# Patient Record
Sex: Female | Born: 1985 | Race: Black or African American | Hispanic: No | Marital: Married | Smoking: Never smoker
Health system: Southern US, Community
[De-identification: ages and names within clinical notes are randomized; demographics above are authoritative.]

## PROBLEM LIST (undated history)

## (undated) ENCOUNTER — Inpatient Hospital Stay (HOSPITAL_COMMUNITY): Payer: Self-pay

## (undated) DIAGNOSIS — B009 Herpesviral infection, unspecified: Secondary | ICD-10-CM

## (undated) DIAGNOSIS — A630 Anogenital (venereal) warts: Secondary | ICD-10-CM

## (undated) DIAGNOSIS — R87629 Unspecified abnormal cytological findings in specimens from vagina: Secondary | ICD-10-CM

## (undated) HISTORY — DX: Anogenital (venereal) warts: A63.0

## (undated) HISTORY — DX: Unspecified abnormal cytological findings in specimens from vagina: R87.629

## (undated) HISTORY — PX: APPENDECTOMY: SHX54

---

## 2006-11-29 ENCOUNTER — Emergency Department (HOSPITAL_COMMUNITY): Admission: EM | Admit: 2006-11-29 | Discharge: 2006-11-29 | Payer: Self-pay | Admitting: Emergency Medicine

## 2008-01-23 IMAGING — CR DG FINGER THUMB 2+V*R*
3 series · 3 of 3 positions shown · non-contrast
Comparison: none

CLINICAL DATA: Crush injury, pain. 
 RIGHT THUMB ? 3 VIEW:

[x finger pa right]
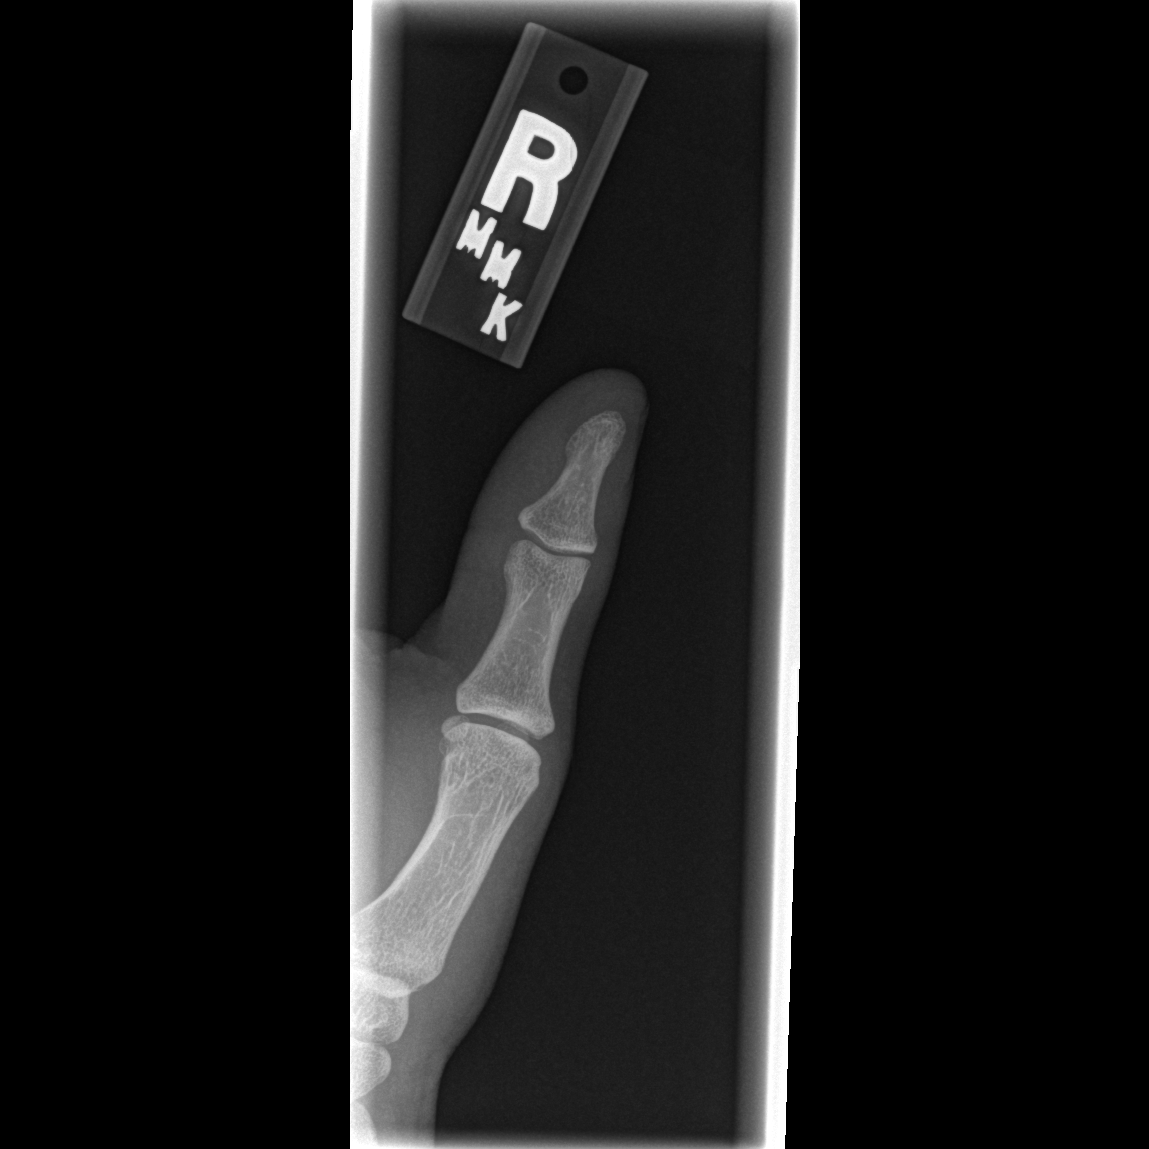

[x finger obl. right]
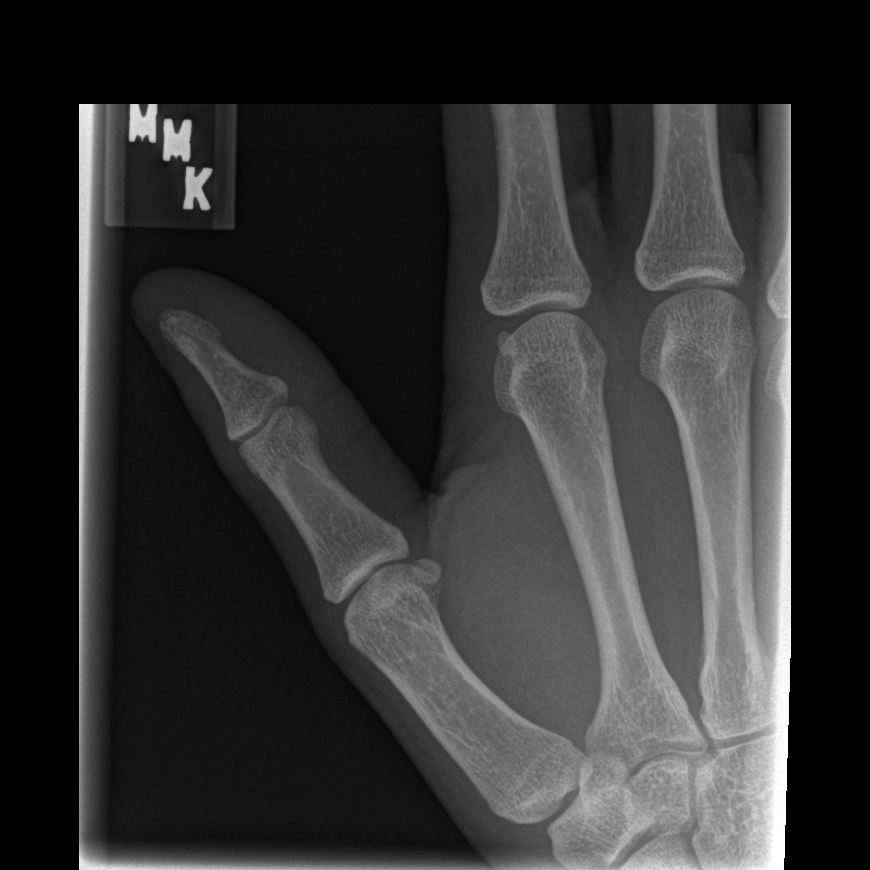

[x finger lateral right]
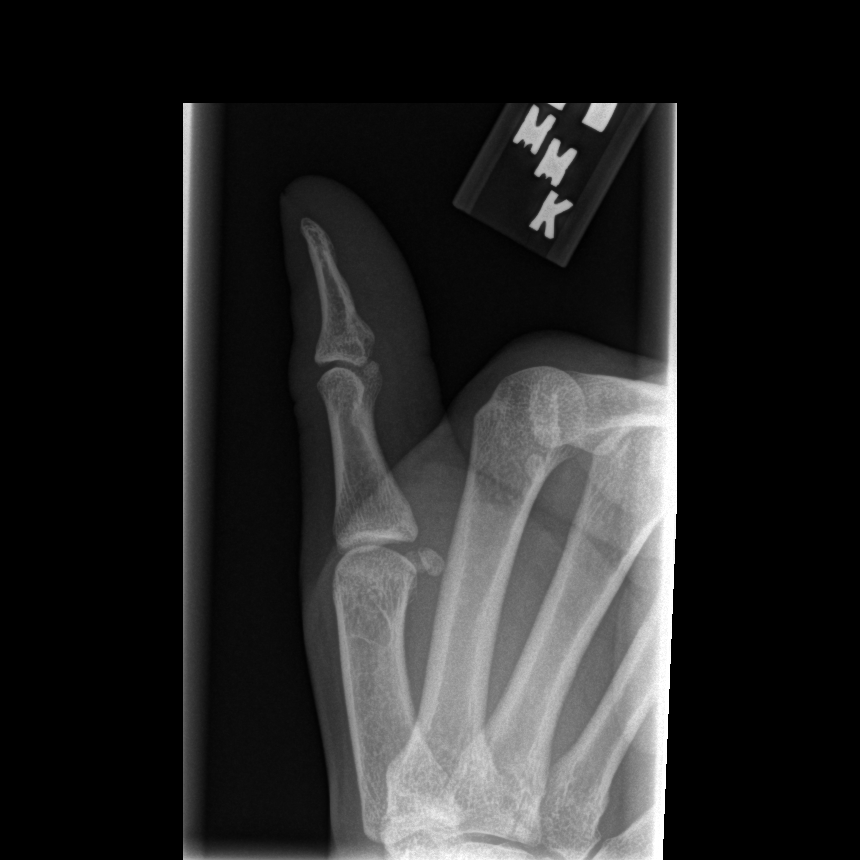

[3 of 3 positions shown; findings below may reference images not displayed]

FINDINGS: The imaged bones, joints and soft tissues appear normal.
IMPRESSION: Negative study.

## 2009-11-16 ENCOUNTER — Emergency Department (HOSPITAL_COMMUNITY): Admission: EM | Admit: 2009-11-16 | Discharge: 2009-11-17 | Payer: Self-pay | Admitting: Emergency Medicine

## 2010-09-18 ENCOUNTER — Emergency Department (HOSPITAL_BASED_OUTPATIENT_CLINIC_OR_DEPARTMENT_OTHER)
Admission: EM | Admit: 2010-09-18 | Discharge: 2010-09-19 | Disposition: A | Discharge status: Home / Self Care | Payer: Medicaid Other | Attending: Emergency Medicine | Admitting: Emergency Medicine

## 2010-09-18 DIAGNOSIS — B9689 Other specified bacterial agents as the cause of diseases classified elsewhere: Secondary | ICD-10-CM | POA: Insufficient documentation

## 2010-09-18 DIAGNOSIS — A499 Bacterial infection, unspecified: Secondary | ICD-10-CM | POA: Insufficient documentation

## 2010-09-18 DIAGNOSIS — N760 Acute vaginitis: Secondary | ICD-10-CM | POA: Insufficient documentation

## 2010-09-18 LAB — URINALYSIS, ROUTINE W REFLEX MICROSCOPIC
Bilirubin Urine: NEGATIVE
Glucose, UA: NEGATIVE mg/dL
Ketones, ur: 15 mg/dL — AB
Leukocytes, UA: NEGATIVE
Nitrite: NEGATIVE
Protein, ur: NEGATIVE mg/dL
Specific Gravity, Urine: 1.037 — ABNORMAL HIGH (ref 1.005–1.030)
Urobilinogen, UA: 1 mg/dL (ref 0.0–1.0)
pH: 6 (ref 5.0–8.0)

## 2010-09-18 LAB — URINE MICROSCOPIC-ADD ON

## 2010-09-18 LAB — PREGNANCY, URINE: Preg Test, Ur: NEGATIVE

## 2010-09-19 LAB — WET PREP, GENITAL

## 2010-09-21 LAB — URINALYSIS, ROUTINE W REFLEX MICROSCOPIC
Leukocytes, UA: NEGATIVE
Nitrite: NEGATIVE
Protein, ur: NEGATIVE mg/dL
Specific Gravity, Urine: 1.024 (ref 1.005–1.030)
Urobilinogen, UA: 1 mg/dL (ref 0.0–1.0)

## 2010-09-21 LAB — WET PREP, GENITAL

## 2010-09-21 LAB — URINE MICROSCOPIC-ADD ON

## 2011-04-30 ENCOUNTER — Emergency Department (HOSPITAL_COMMUNITY)
Admission: EM | Admit: 2011-04-30 | Discharge: 2011-05-01 | Disposition: A | Discharge status: Home / Self Care | Payer: Medicaid Other | Attending: Emergency Medicine | Admitting: Emergency Medicine

## 2011-04-30 DIAGNOSIS — A6000 Herpesviral infection of urogenital system, unspecified: Secondary | ICD-10-CM | POA: Insufficient documentation

## 2011-04-30 DIAGNOSIS — B9689 Other specified bacterial agents as the cause of diseases classified elsewhere: Secondary | ICD-10-CM | POA: Insufficient documentation

## 2011-04-30 DIAGNOSIS — A499 Bacterial infection, unspecified: Secondary | ICD-10-CM | POA: Insufficient documentation

## 2011-04-30 DIAGNOSIS — N760 Acute vaginitis: Secondary | ICD-10-CM | POA: Insufficient documentation

## 2011-05-01 LAB — URINALYSIS, ROUTINE W REFLEX MICROSCOPIC
Hgb urine dipstick: NEGATIVE
Leukocytes, UA: NEGATIVE
Nitrite: NEGATIVE
Protein, ur: NEGATIVE mg/dL
Specific Gravity, Urine: 1.026 (ref 1.005–1.030)
Urobilinogen, UA: 1 mg/dL (ref 0.0–1.0)

## 2011-05-01 LAB — WET PREP, GENITAL: Trich, Wet Prep: NONE SEEN

## 2011-05-01 LAB — POCT PREGNANCY, URINE: Preg Test, Ur: NEGATIVE

## 2011-05-03 LAB — GC/CHLAMYDIA PROBE AMP, GENITAL: GC Probe Amp, Genital: NEGATIVE

## 2011-05-06 ENCOUNTER — Encounter: Payer: Self-pay | Admitting: *Deleted

## 2011-05-06 ENCOUNTER — Emergency Department (HOSPITAL_BASED_OUTPATIENT_CLINIC_OR_DEPARTMENT_OTHER)
Admission: EM | Admit: 2011-05-06 | Discharge: 2011-05-06 | Disposition: A | Discharge status: Home / Self Care | Payer: Medicaid Other | Attending: Emergency Medicine | Admitting: Emergency Medicine

## 2011-05-06 DIAGNOSIS — N760 Acute vaginitis: Secondary | ICD-10-CM | POA: Insufficient documentation

## 2011-05-06 DIAGNOSIS — A499 Bacterial infection, unspecified: Secondary | ICD-10-CM | POA: Insufficient documentation

## 2011-05-06 DIAGNOSIS — N390 Urinary tract infection, site not specified: Secondary | ICD-10-CM | POA: Insufficient documentation

## 2011-05-06 DIAGNOSIS — B379 Candidiasis, unspecified: Secondary | ICD-10-CM | POA: Insufficient documentation

## 2011-05-06 DIAGNOSIS — B9689 Other specified bacterial agents as the cause of diseases classified elsewhere: Secondary | ICD-10-CM | POA: Insufficient documentation

## 2011-05-06 LAB — URINE MICROSCOPIC-ADD ON

## 2011-05-06 LAB — URINALYSIS, ROUTINE W REFLEX MICROSCOPIC
Bilirubin Urine: NEGATIVE
Hgb urine dipstick: NEGATIVE
Specific Gravity, Urine: 1.022 (ref 1.005–1.030)
Urobilinogen, UA: 0.2 mg/dL (ref 0.0–1.0)
pH: 6.5 (ref 5.0–8.0)

## 2011-05-06 LAB — WET PREP, GENITAL

## 2011-05-06 LAB — PREGNANCY, URINE: Preg Test, Ur: NEGATIVE

## 2011-05-06 MED ORDER — NITROFURANTOIN MONOHYD MACRO 100 MG PO CAPS: 100.0000 mg | ORAL_CAPSULE | Freq: Two times a day (BID) | ORAL | Status: AC

## 2011-05-06 MED ORDER — LIDOCAINE HCL (PF) 1 % IJ SOLN
INTRAMUSCULAR | Status: AC
  Filled 2011-05-06: qty 5

## 2011-05-06 MED ORDER — CEFTRIAXONE SODIUM 250 MG IJ SOLR
250.0000 mg | Freq: Once | INTRAMUSCULAR | Status: AC
  Administered 2011-05-06: 250 mg via INTRAMUSCULAR
  Filled 2011-05-06: qty 250

## 2011-05-06 MED ORDER — CLINDAMYCIN HCL 300 MG PO CAPS: 300.0000 mg | ORAL_CAPSULE | Freq: Two times a day (BID) | ORAL | Status: AC

## 2011-05-06 MED ORDER — FLUCONAZOLE 150 MG PO TABS: 150.0000 mg | ORAL_TABLET | Freq: Every day | ORAL | Status: AC

## 2011-05-06 MED ORDER — AZITHROMYCIN 250 MG PO TABS
1000.0000 mg | ORAL_TABLET | Freq: Once | ORAL | Status: AC
  Administered 2011-05-06: 1000 mg via ORAL
  Filled 2011-05-06: qty 4

## 2011-05-06 NOTE — ED Provider Notes (Signed)
History     CSN: [id number] Arrival date & time: 05/06/2011  7:35 PM   First MD Initiated Contact with Patient 05/06/11 1953      Chief Complaint  Patient presents with  . Vaginal Itching    (Consider location/radiation/quality/duration/timing/severity/associated sxs/prior treatment) HPI Comments: Pt states that she was seen 4 days ago at [place] and diagnosed with bv and the symptoms seem to be worse and she is having itching and pain in the vaginal area:pt states that she use miconazole cream 2 days ago  Patient is a 25 y.o. female presenting with vaginal itching. The history is provided by the patient. No language interpreter was used.  Vaginal Itching This is a recurrent problem. The current episode started today. The problem occurs constantly. The problem has been unchanged. Pertinent negatives include no fever, numbness, urinary symptoms or vomiting. The symptoms are aggravated by nothing.  Vaginal Itching This is a recurrent problem. The current episode started today. The problem occurs constantly. The problem has been unchanged. The symptoms are aggravated by nothing.    History reviewed. No pertinent past medical history.  History reviewed. No pertinent past surgical history.  History reviewed. No pertinent family history.  History  Substance Use Topics  . Smoking status: Not on file  . Smokeless tobacco: Not on file  . Alcohol Use: No    OB History    Grav Para Term Preterm Abortions TAB SAB Ect Mult Living                  Review of Systems  Constitutional: Negative for fever.  Gastrointestinal: Negative for vomiting.  Neurological: Negative for numbness.  All other systems reviewed and are negative.    Allergies  Review of patient's allergies indicates no known allergies.  Home Medications   Current Outpatient Rx  Name Route Sig Dispense Refill  . METRONIDAZOLE 500 MG PO TABS Oral Take 500 mg by mouth 2 (two) times daily.      Marland Kitchen METRONIDAZOLE 0.75  % VA GEL Vaginal Place 1 Applicatorful vaginally 2 (two) times daily.      Carma Leaven M PLUS PO TABS Oral Take 1 tablet by mouth daily.      Marland Kitchen VALACYCLOVIR HCL 500 MG PO TABS Oral Take 500 mg by mouth daily.        BP 125/79  Pulse 88  Temp(Src) 98.4 F (36.9 C) (Oral)  Resp 16  Ht 5\' 4"  (1.626 m)  Wt 120 lb (54.432 kg)  BMI 20.60 kg/m2  SpO2 100%  LMP 04/18/2011  Physical Exam  Nursing note and vitals reviewed. Constitutional: She is oriented to person, place, and time. She appears well-developed and well-nourished.  HENT:  Head: Normocephalic and atraumatic.  Cardiovascular: Normal rate and regular rhythm.   Pulmonary/Chest: Effort normal and breath sounds normal.  Abdominal: Soft.  Genitourinary: Cervix exhibits no motion tenderness. Vaginal discharge found.       Pt has green vaginal discharge  Musculoskeletal: Normal range of motion.  Neurological: She is alert and oriented to person, place, and time.  Skin: Skin is warm and dry.  Psychiatric: She has a normal mood and affect.    ED Course  Procedures (including critical care time)  Labs Reviewed  URINALYSIS, ROUTINE W REFLEX MICROSCOPIC - Abnormal; Notable for the following:    Leukocytes, UA LARGE (*)    All other components within normal limits  WET PREP, GENITAL - Abnormal; Notable for the following:    Yeast, Wet Prep  FEW (*)    Trich, Wet Prep NONE (*)    Clue Cells, Wet Prep MANY (*)    WBC, Wet Prep HPF POC MODERATE (*)    All other components within normal limits  URINE MICROSCOPIC-ADD ON - Abnormal; Notable for the following:    Squamous Epithelial / LPF FEW (*)    All other components within normal limits  PREGNANCY, URINE   No results found.   1. BV (bacterial vaginosis)   2. Candidiasis   3. UTI (lower urinary tract infection)       MDM  Pt treated for std here based on the green discharge    Medical screening examination/treatment/procedure(s) were performed by non-physician  practitioner and as supervising physician I was immediately available for consultation/collaboration. Osvaldo Human, M.D.     Teressa Lower, NP 05/06/11 2059  Carleene Cooper III, MD 05/07/11 [phone]

## 2011-05-06 NOTE — ED Notes (Signed)
Pt c/o ? Yeast infection from abx when seen at [facility].  DX BV

## 2012-10-29 ENCOUNTER — Emergency Department (HOSPITAL_COMMUNITY)
Admission: EM | Admit: 2012-10-29 | Discharge: 2012-10-29 | Disposition: A | Discharge status: Home / Self Care | Payer: BC Managed Care – PPO | Attending: Emergency Medicine | Admitting: Emergency Medicine

## 2012-10-29 ENCOUNTER — Encounter (HOSPITAL_COMMUNITY): Payer: Self-pay | Admitting: Nurse Practitioner

## 2012-10-29 DIAGNOSIS — R1110 Vomiting, unspecified: Secondary | ICD-10-CM | POA: Insufficient documentation

## 2012-10-29 DIAGNOSIS — B9689 Other specified bacterial agents as the cause of diseases classified elsewhere: Secondary | ICD-10-CM

## 2012-10-29 DIAGNOSIS — Z3490 Encounter for supervision of normal pregnancy, unspecified, unspecified trimester: Secondary | ICD-10-CM

## 2012-10-29 DIAGNOSIS — N760 Acute vaginitis: Secondary | ICD-10-CM | POA: Insufficient documentation

## 2012-10-29 DIAGNOSIS — O2390 Unspecified genitourinary tract infection in pregnancy, unspecified trimester: Secondary | ICD-10-CM | POA: Insufficient documentation

## 2012-10-29 DIAGNOSIS — R109 Unspecified abdominal pain: Secondary | ICD-10-CM | POA: Insufficient documentation

## 2012-10-29 DIAGNOSIS — Z3201 Encounter for pregnancy test, result positive: Secondary | ICD-10-CM | POA: Insufficient documentation

## 2012-10-29 LAB — TYPE AND SCREEN
ABO/RH(D): B POS
Antibody Screen: NEGATIVE

## 2012-10-29 LAB — CBC
Platelets: 264 10*3/uL (ref 150–400)
RBC: 4.25 MIL/uL (ref 3.87–5.11)
WBC: 9.4 10*3/uL (ref 4.0–10.5)

## 2012-10-29 LAB — PREGNANCY, URINE: Preg Test, Ur: POSITIVE — AB

## 2012-10-29 LAB — WET PREP, GENITAL: Trich, Wet Prep: NONE SEEN

## 2012-10-29 MED ORDER — METRONIDAZOLE 0.75 % VA GEL: 1.0000 | Freq: Two times a day (BID) | VAGINAL | Status: DC

## 2012-10-29 MED ORDER — SODIUM CHLORIDE 0.9 % IV BOLUS (SEPSIS)
1000.0000 mL | Freq: Once | INTRAVENOUS | Status: AC
  Administered 2012-10-29: 1000 mL via INTRAVENOUS

## 2012-10-29 MED ORDER — ONDANSETRON HCL 4 MG/2ML IJ SOLN
4.0000 mg | Freq: Once | INTRAMUSCULAR | Status: AC
  Administered 2012-10-29: 4 mg via INTRAVENOUS
  Filled 2012-10-29: qty 2

## 2012-10-29 MED ORDER — ONDANSETRON HCL 4 MG PO TABS: 4.0000 mg | ORAL_TABLET | Freq: Four times a day (QID) | ORAL | Status: DC

## 2012-10-29 NOTE — ED Provider Notes (Signed)
History     CSN: [id number]  Arrival date & time 10/29/12  1517   First MD Initiated Contact with Patient 10/29/12 1955      Chief Complaint  Patient presents with  . Vaginal Bleeding    (Consider location/radiation/quality/duration/timing/severity/associated sxs/prior treatment) HPI Comments: Patient is 7, weeks, pregnant.  She's had OB/GYN care up to this point.  Today.  She noticed, that she had bloody, vaginal discharge, and cramping x1 episode.  She, states, that she has not seen any more blood since that time.  Allergies use the bathroom several times.  She still has mild cramping.  She reports, that she's had daily nausea and vomiting.  Her OB/GYN.  Has not prescribed any medication for her telling her she could use over-the-counter Unisom to control her nausea.  Symptoms  Patient is a 27 y.o. female presenting with vaginal bleeding. The history is provided by the patient.  Vaginal Bleeding This is a new problem. The current episode started today. The problem has been gradually improving. Associated symptoms include abdominal pain and vomiting. Pertinent negatives include no chills, fever or rash. Nothing aggravates the symptoms. She has tried nothing for the symptoms.    History reviewed. No pertinent past medical history.  Past Surgical History  Procedure Laterality Date  . Cesarean section      History reviewed. No pertinent family history.  History  Substance Use Topics  . Smoking status: Never Smoker   . Smokeless tobacco: Not on file  . Alcohol Use: No    OB History   Grav Para Term Preterm Abortions TAB SAB Ect Mult Living                  Review of Systems  Constitutional: Negative for fever and chills.  Gastrointestinal: Positive for vomiting and abdominal pain. Negative for diarrhea and constipation.  Genitourinary: Positive for vaginal bleeding and vaginal discharge. Negative for dysuria, frequency and vaginal pain.  Skin: Negative for rash and wound.   All other systems reviewed and are negative.    Allergies  Review of patient's allergies indicates no known allergies.  Home Medications   Current Outpatient Rx  Name  Route  Sig  Dispense  Refill  . metroNIDAZOLE (METROGEL VAGINAL) 0.75 % vaginal gel   Vaginal   Place 1 Applicatorful vaginally 2 (two) times daily.   70 g   0     BP 103/49  Pulse 77  Temp(Src) 99.1 F (37.3 C) (Oral)  Resp 20  SpO2 100%  Physical Exam  Constitutional: She appears well-developed.  HENT:  Head: Normocephalic and atraumatic.  Eyes: Pupils are equal, round, and reactive to light.  Neck: Normal range of motion.  Cardiovascular: Normal rate.   Pulmonary/Chest: Effort normal.  Abdominal: Soft.  Genitourinary: Uterus is enlarged. Uterus is not tender. Cervix exhibits discharge. Vaginal discharge found.  Musculoskeletal: Normal range of motion.  Neurological: She is alert.  Skin: Skin is warm and dry.    ED Course  Procedures (including critical care time)  Labs Reviewed  WET PREP, GENITAL - Abnormal; Notable for the following:    Clue Cells Wet Prep HPF POC MANY (*)    WBC, Wet Prep HPF POC MODERATE (*)    All other components within normal limits  CBC - Abnormal; Notable for the following:    HCT 33.6 (*)    All other components within normal limits  PREGNANCY, URINE - Abnormal; Notable for the following:    Preg Test, Ur POSITIVE (*)  All other components within normal limits  HCG, QUANTITATIVE, PREGNANCY - Abnormal; Notable for the following:    hCG, Beta Chain, Quant, [place] [address] (*)    All other components within normal limits  GC/CHLAMYDIA PROBE AMP  TYPE AND SCREEN  ABO/RH   No results found.   1. Pregnancy   2. Bacterial vaginitis       MDM   That she has bacterial vaginosis she will be treated with MetroGel vaginal instructed to followup with her primary care physician        Arman Filter, NP 10/29/12 2151

## 2012-10-29 NOTE — ED Notes (Signed)
Pt reports she is [redacted] weeks pregnant and today she had some bloody vaginal discharge x 1 and pelvic cramps.

## 2012-10-29 NOTE — ED Notes (Signed)
Wait time discussed 

## 2012-10-30 NOTE — ED Provider Notes (Signed)
Medical screening examination/treatment/procedure(s) were performed by non-physician practitioner and as supervising physician I was immediately available for consultation/collaboration.   Celene Kras, MD 10/30/12 [phone]

## 2013-09-18 ENCOUNTER — Encounter: Payer: Self-pay | Admitting: Obstetrics & Gynecology

## 2013-09-18 ENCOUNTER — Ambulatory Visit (INDEPENDENT_AMBULATORY_CARE_PROVIDER_SITE_OTHER): Payer: 59

## 2013-09-18 DIAGNOSIS — Z3490 Encounter for supervision of normal pregnancy, unspecified, unspecified trimester: Secondary | ICD-10-CM

## 2013-09-18 DIAGNOSIS — Z3480 Encounter for supervision of other normal pregnancy, unspecified trimester: Secondary | ICD-10-CM

## 2013-09-18 DIAGNOSIS — Z3201 Encounter for pregnancy test, result positive: Secondary | ICD-10-CM

## 2013-09-18 LAB — POCT PREGNANCY, URINE: Preg Test, Ur: POSITIVE — AB

## 2013-09-18 NOTE — Progress Notes (Signed)
Pt here for pregnancy test resulted positive.  LMP 08/18/13 pt is for sure about it.  Would like to have care here at the clinics.  G7 P2 T5 L1.   Scheduled US for anatomy and will draw initial labs today.  Pt agreed.  Initial OB visit will be scheduled.

## 2013-09-19 LAB — PRESCRIPTION MONITORING PROFILE (19 PANEL)
AMPHETAMINE/METH: NEGATIVE ng/mL
BARBITURATE SCREEN, URINE: NEGATIVE ng/mL
Benzodiazepine Screen, Urine: NEGATIVE ng/mL
Buprenorphine, Urine: NEGATIVE ng/mL
CARISOPRODOL, URINE: NEGATIVE ng/mL
CREATININE, URINE: 227.78 mg/dL (ref >20.0)
Cannabinoid Scrn, Ur: NEGATIVE ng/mL
Cocaine Metabolites: NEGATIVE ng/mL
Fentanyl, Ur: NEGATIVE ng/mL
MDMA URINE: NEGATIVE ng/mL
METHADONE SCREEN, URINE: NEGATIVE ng/mL
Meperidine, Ur: NEGATIVE ng/mL
Methaqualone: NEGATIVE ng/mL
NITRITES URINE, INITIAL: NEGATIVE ug/mL
OPIATE SCREEN, URINE: NEGATIVE ng/mL
OXYCODONE SCRN UR: NEGATIVE ng/mL
PROPOXYPHENE: NEGATIVE ng/mL
Phencyclidine, Ur: NEGATIVE ng/mL
TAPENTADOLUR: NEGATIVE ng/mL
TRAMADOL UR: NEGATIVE ng/mL
Zolpidem, Urine: NEGATIVE ng/mL
pH, Initial: 6.9 pH (ref 4.5–8.9)

## 2013-09-19 LAB — OBSTETRIC PANEL
ANTIBODY SCREEN: NEGATIVE
BASOS PCT: 0 % (ref 0–1)
Basophils Absolute: 0 10*3/uL (ref 0.0–0.1)
EOS ABS: 0 10*3/uL (ref 0.0–0.7)
Eosinophils Relative: 1 % (ref 0–5)
HEMATOCRIT: 38.8 % (ref 36.0–46.0)
HEMOGLOBIN: 12.9 g/dL (ref 12.0–15.0)
HEP B S AG: NEGATIVE
LYMPHS ABS: 1.4 10*3/uL (ref 0.7–4.0)
Lymphocytes Relative: 29 % (ref 12–46)
MCH: 27.6 pg (ref 26.0–34.0)
MCHC: 33.2 g/dL (ref 30.0–36.0)
MCV: 82.9 fL (ref 78.0–100.0)
MONO ABS: 0.4 10*3/uL (ref 0.1–1.0)
MONOS PCT: 8 % (ref 3–12)
NEUTROS ABS: 3 10*3/uL (ref 1.7–7.7)
NEUTROS PCT: 62 % (ref 43–77)
Platelets: 300 10*3/uL (ref 150–400)
RBC: 4.68 MIL/uL (ref 3.87–5.11)
RDW: 13.4 % (ref 11.5–15.5)
RH TYPE: POSITIVE
Rubella: 4.25 Index — ABNORMAL HIGH (ref <0.90)
WBC: 4.9 10*3/uL (ref 4.0–10.5)

## 2013-09-19 LAB — HIV ANTIBODY (ROUTINE TESTING W REFLEX): HIV: NONREACTIVE

## 2013-09-20 LAB — HEMOGLOBINOPATHY EVALUATION
HEMOGLOBIN OTHER: 0 %
Hgb A2 Quant: 2.5 % (ref 2.2–3.2)
Hgb A: 97.5 % (ref 96.8–97.8)
Hgb F Quant: 0 % (ref 0.0–2.0)
Hgb S Quant: 0 %

## 2013-09-22 LAB — URINE CULTURE

## 2013-09-24 ENCOUNTER — Other Ambulatory Visit: Payer: Self-pay | Admitting: Family

## 2013-09-24 MED ORDER — NITROFURANTOIN MONOHYD MACRO 100 MG PO CAPS: 100.0000 mg | ORAL_CAPSULE | Freq: Two times a day (BID) | ORAL | Status: DC

## 2013-09-24 NOTE — Progress Notes (Signed)
Pt notified regarding UTI and RX for Macrobid sent to pharmacy.

## 2013-09-28 ENCOUNTER — Telehealth: Payer: Self-pay | Admitting: *Deleted

## 2013-09-28 DIAGNOSIS — R110 Nausea: Secondary | ICD-10-CM

## 2013-09-28 MED ORDER — ONDANSETRON HCL 4 MG PO TABS: 4.0000 mg | ORAL_TABLET | Freq: Four times a day (QID) | ORAL | Status: DC

## 2013-09-28 NOTE — Telephone Encounter (Signed)
Pt returned call to clinic, states she is vomiting with medication.  Encouraged to eat with food, discussed with D. Poe CNM and she stated to eat in applesauce and to order zofran prn to go along with taking medication.  Pt verbalizes understanding,.  [facility] only [facility] the medication Macrobid comes in and to encourage to take with food.

## 2013-09-28 NOTE — Telephone Encounter (Signed)
Pt called nurse line with concern over "feeling sick:" since she started taking medication for UTI. Request call back to speak with nurse.

## 2013-09-28 NOTE — Telephone Encounter (Signed)
Called patient and left message we were returning her call and to call back before 12 if she needs to speak with us today (Friday).

## 2013-10-09 ENCOUNTER — Encounter (HOSPITAL_COMMUNITY): Payer: Self-pay

## 2013-10-09 ENCOUNTER — Inpatient Hospital Stay (HOSPITAL_COMMUNITY)
Admission: AD | Admit: 2013-10-09 | Discharge: 2013-10-09 | Disposition: A | Discharge status: Home / Self Care | Payer: 59 | Source: Ambulatory Visit | Attending: Obstetrics & Gynecology | Admitting: Obstetrics & Gynecology

## 2013-10-09 DIAGNOSIS — O219 Vomiting of pregnancy, unspecified: Secondary | ICD-10-CM

## 2013-10-09 DIAGNOSIS — O98519 Other viral diseases complicating pregnancy, unspecified trimester: Secondary | ICD-10-CM | POA: Diagnosis not present

## 2013-10-09 DIAGNOSIS — A6000 Herpesviral infection of urogenital system, unspecified: Secondary | ICD-10-CM | POA: Diagnosis not present

## 2013-10-09 DIAGNOSIS — O210 Mild hyperemesis gravidarum: Secondary | ICD-10-CM | POA: Diagnosis present

## 2013-10-09 HISTORY — DX: Herpesviral infection, unspecified: B00.9

## 2013-10-09 LAB — URINALYSIS, ROUTINE W REFLEX MICROSCOPIC
BILIRUBIN URINE: NEGATIVE
Glucose, UA: NEGATIVE mg/dL
Hgb urine dipstick: NEGATIVE
KETONES UR: NEGATIVE mg/dL
Leukocytes, UA: NEGATIVE
NITRITE: NEGATIVE
PH: 6.5 (ref 5.0–8.0)
PROTEIN: NEGATIVE mg/dL
Specific Gravity, Urine: 1.025 (ref 1.005–1.030)
UROBILINOGEN UA: 0.2 mg/dL (ref 0.0–1.0)

## 2013-10-09 MED ORDER — PROMETHAZINE HCL 25 MG PO TABS: 12.5000 mg | ORAL_TABLET | Freq: Four times a day (QID) | ORAL | Status: DC | PRN

## 2013-10-09 MED ORDER — GLYCOPYRROLATE 2 MG PO TABS: 2.0000 mg | ORAL_TABLET | Freq: Three times a day (TID) | ORAL | Status: DC | PRN

## 2013-10-09 MED ORDER — ONDANSETRON 8 MG PO TBDP
8.0000 mg | ORAL_TABLET | ORAL | Status: AC
  Administered 2013-10-09: 8 mg via ORAL
  Filled 2013-10-09: qty 1

## 2013-10-09 MED ORDER — GLYCOPYRROLATE 1 MG PO TABS
2.0000 mg | ORAL_TABLET | ORAL | Status: AC
  Administered 2013-10-09: 2 mg via ORAL
  Filled 2013-10-09: qty 2

## 2013-10-09 NOTE — Discharge Instructions (Signed)
Morning Sickness °Morning sickness is when you feel sick to your stomach (nauseous) during pregnancy. This nauseous feeling may or may not come with vomiting. It often occurs in the morning but can be a problem any time of day. Morning sickness is most common during the first trimester, but it may continue throughout pregnancy. While morning sickness is unpleasant, it is usually harmless unless you develop severe and continual vomiting (hyperemesis gravidarum). This condition requires more intense treatment.  °CAUSES  °The cause of morning sickness is not completely known but seems to be related to normal hormonal changes that occur in pregnancy. °RISK FACTORS °You are at greater risk if you: °· Experienced nausea or vomiting before your pregnancy. °· Had morning sickness during a previous pregnancy. °· Are pregnant with more than one baby, such as twins. °TREATMENT  °Do not use any medicines (prescription, over-the-counter, or herbal) for morning sickness without first talking to your health care provider. Your health care provider may prescribe or recommend: °· Vitamin B6 supplements. °· Anti-nausea medicines. °· The herbal medicine ginger. °HOME CARE INSTRUCTIONS  °· Only take over-the-counter or prescription medicines as directed by your health care provider. °· Taking multivitamins before getting pregnant can prevent or decrease the severity of morning sickness in most women.   °· Eat a piece of dry toast or unsalted crackers before getting out of bed in the morning.   °· Eat five or six small meals a day.   °· Eat dry and bland foods (rice, baked potato ). Foods high in carbohydrates are often helpful.  °· Do not drink liquids with your meals. Drink liquids between meals.   °· Avoid greasy, fatty, and spicy foods.   °· Get someone to cook for you if the smell of any food causes nausea and vomiting.   °· If you feel nauseous after taking prenatal vitamins, take the vitamins at night or with a snack.  °· Snack  on protein foods (nuts, yogurt, cheese) between meals if you are hungry.   °· Eat unsweetened gelatins for desserts.   °· Wearing an acupressure wristband (worn for sea sickness) may be helpful.   °· Acupuncture may be helpful.   °· Do not smoke.   °· Get a humidifier to keep the air in your house free of odors.   °· Get plenty of fresh air. °SEEK MEDICAL CARE IF:  °· Your home remedies are not working, and you need medicine. °· You feel dizzy or lightheaded. °· You are losing weight. °SEEK IMMEDIATE MEDICAL CARE IF:  °· You have persistent and uncontrolled nausea and vomiting. °· You pass out (faint). °Document Released: 08/12/2006 Document Revised: 02/21/2013 Document Reviewed: 12/06/2012 °ExitCare® Patient Information ©2014 [employer]. ° °

## 2013-10-09 NOTE — MAU Provider Note (Signed)
Chief Complaint: No chief complaint on file.   First Provider Initiated Contact with Patient 10/09/13 [phone]     SUBJECTIVE HPI: Claire RogersDominique Ratterman is a 28 y.o. [id number] at 6649w3d by LMP who presents to maternity admissions reporting nausea with vomiting 4-5x/day.  She also reports upper abdominal burning with vomiting and intermittent dizziness.  She was not aware of her Zofran Rx and has not taken anything for nausea.  She is spitting into a bottle during her visit in MAU.  She denies lower abdominal pain, vaginal bleeding, vaginal itching/burning, urinary symptoms, h/a, dizziness, or fever/chills.     Past Medical History  Diagnosis Date  . Herpes     last outbreak, last year   Past Surgical History  Procedure Laterality Date  . Cesarean section     History   Social History  . Marital Status: Married    Spouse Name: N/A    Number of Children: N/A  . Years of Education: N/A   Occupational History  . Not on file.   Social History Main Topics  . Smoking status: Never Smoker   . Smokeless tobacco: Not on file  . Alcohol Use: No  . Drug Use: No  . Sexual Activity: Yes     Comment: last sex yesterday   Other Topics Concern  . Not on file   Social History Narrative  . No narrative on file   No current facility-administered medications on file prior to encounter.   Current Outpatient Prescriptions on File Prior to Encounter  Medication Sig Dispense Refill  . nitrofurantoin, macrocrystal-monohydrate, (MACROBID) 100 MG capsule Take 1 capsule (100 mg total) by mouth 2 (two) times daily.  14 capsule  1  . ondansetron (ZOFRAN) 4 MG tablet Take 1 tablet (4 mg total) by mouth every 6 (six) hours.  12 tablet  0   No Known Allergies  ROS: Pertinent items in HPI  OBJECTIVE Blood pressure 121/78, pulse 92, temperature 98.6 F (37 C), temperature source Oral, resp. rate 16, height 5\' 4"  (1.626 m), last menstrual period 08/18/2013, SpO2 100.00%. GENERAL: Well-developed,  well-nourished female in no acute distress.  HEENT: Normocephalic HEART: normal rate RESP: normal effort ABDOMEN: Soft, non-tender EXTREMITIES: Nontender, no edema NEURO: Alert and oriented   LAB RESULTS Results for orders placed during the hospital encounter of 10/09/13 (from the past 24 hour(s))  URINALYSIS, ROUTINE W REFLEX MICROSCOPIC     Status: None   Collection Time    10/09/13  3:00 AM      Result Value Ref Range   Color, Urine YELLOW  YELLOW   APPearance CLEAR  CLEAR   Specific Gravity, Urine 1.025  1.005 - 1.030   pH 6.5  5.0 - 8.0   Glucose, UA NEGATIVE  NEGATIVE mg/dL   Hgb urine dipstick NEGATIVE  NEGATIVE   Bilirubin Urine NEGATIVE  NEGATIVE   Ketones, ur NEGATIVE  NEGATIVE mg/dL   Protein, ur NEGATIVE  NEGATIVE mg/dL   Urobilinogen, UA 0.2  0.0 - 1.0 mg/dL   Nitrite NEGATIVE  NEGATIVE   Leukocytes, UA NEGATIVE  NEGATIVE    ASSESSMENT 1. Nausea and vomiting in pregnancy prior to [redacted] weeks gestation     PLAN Zofran 8 mg ODT and Robinul 2 mg in MAU Discharge home Rx for Phenergan prn nausea and Robinul for spitting Keep scheduled prenatal appointment Return to MAU as needed for emergencies    Medication List    STOP taking these medications       metroNIDAZOLE 0.75 %  vaginal gel  Commonly known as:  METROGEL VAGINAL      TAKE these medications       glycopyrrolate 2 MG tablet  Commonly known as:  ROBINUL  Take 1 tablet (2 mg total) by mouth 3 (three) times daily as needed.     nitrofurantoin (macrocrystal-monohydrate) 100 MG capsule  Commonly known as:  MACROBID  Take 1 capsule (100 mg total) by mouth 2 (two) times daily.     ondansetron 4 MG tablet  Commonly known as:  ZOFRAN  Take 1 tablet (4 mg total) by mouth every 6 (six) hours.     promethazine 25 MG tablet  Commonly known as:  PHENERGAN  Take 0.5-1 tablets (12.5-25 mg total) by mouth every 6 (six) hours as needed for nausea or vomiting.       Follow-up Information   Follow up  with [facility]. (As scheduled. Return to MAU as needed for emergencies.)    Specialty:  Obstetrics and Gynecology   Contact information:   [address] [place] [address] [phone]      Sharen Counter Certified Nurse-Midwife 10/09/2013  5:13 AM

## 2013-10-09 NOTE — MAU Note (Signed)
[facility].  Started vomiting one week ago and typically vomited 4X daily. Her first appt at clinic is April 14th,  Reports she was dizzy and hot during vomiting episodes tonight. Denies vaginal bleeding or discharge.

## 2013-10-09 NOTE — MAU Provider Note (Signed)
Attestation of Attending Supervision of Advanced Practitioner (CNM/NP): Evaluation and management procedures were performed by the Advanced Practitioner under my supervision and collaboration.  I have reviewed the Advanced Practitioner's note and chart, and I agree with the management and plan.  HARRAWAY-SMITH, Kajuan Guyton 7:11 AM     

## 2013-10-17 ENCOUNTER — Encounter: Payer: Self-pay | Admitting: Obstetrics & Gynecology

## 2013-10-17 ENCOUNTER — Encounter: Payer: Self-pay | Admitting: Obstetrics and Gynecology

## 2013-10-17 ENCOUNTER — Ambulatory Visit (INDEPENDENT_AMBULATORY_CARE_PROVIDER_SITE_OTHER): Payer: 59 | Admitting: Obstetrics and Gynecology

## 2013-10-17 VITALS — BP 125/77 | Temp 98.6°F | Wt 133.6 lb

## 2013-10-17 DIAGNOSIS — Z3490 Encounter for supervision of normal pregnancy, unspecified, unspecified trimester: Secondary | ICD-10-CM

## 2013-10-17 DIAGNOSIS — Z98891 History of uterine scar from previous surgery: Secondary | ICD-10-CM | POA: Insufficient documentation

## 2013-10-17 DIAGNOSIS — Z9889 Other specified postprocedural states: Secondary | ICD-10-CM

## 2013-10-17 DIAGNOSIS — Z23 Encounter for immunization: Secondary | ICD-10-CM

## 2013-10-17 DIAGNOSIS — O219 Vomiting of pregnancy, unspecified: Secondary | ICD-10-CM | POA: Insufficient documentation

## 2013-10-17 DIAGNOSIS — Z3480 Encounter for supervision of other normal pregnancy, unspecified trimester: Secondary | ICD-10-CM | POA: Insufficient documentation

## 2013-10-17 DIAGNOSIS — O210 Mild hyperemesis gravidarum: Secondary | ICD-10-CM

## 2013-10-17 LAB — POCT URINALYSIS DIP (DEVICE)
Glucose, UA: NEGATIVE mg/dL
Nitrite: NEGATIVE
PH: 6.5 (ref 5.0–8.0)
PROTEIN: 100 mg/dL — AB
Specific Gravity, Urine: >=1.03 (ref 1.005–1.030)
UROBILINOGEN UA: 0.2 mg/dL (ref 0.0–1.0)

## 2013-10-17 MED ORDER — ONDANSETRON 4 MG PO TBDP: 4.0000 mg | ORAL_TABLET | Freq: Four times a day (QID) | ORAL | Status: DC | PRN

## 2013-10-17 MED ORDER — PROMETHAZINE HCL 12.5 MG PO TABS: 12.5000 mg | ORAL_TABLET | Freq: Four times a day (QID) | ORAL | Status: DC | PRN

## 2013-10-17 NOTE — Progress Notes (Signed)
Pulse- 97  Pain-ligament Pt reports having constipation, throwing up after eating.  Zofran she takes every 6 hours and is effective but take around the clock.  Pt reports dizziness, headaches.  Weight 25-35lbs

## 2013-10-17 NOTE — Patient Instructions (Signed)
Hyperemesis Gravidarum  Hyperemesis gravidarum is a severe form of nausea and vomiting that happens during pregnancy. Hyperemesis is worse than morning sickness. It may cause you to have nausea or vomiting all day for many days. It may keep you from eating and drinking enough food and liquids. Hyperemesis usually occurs during the first half (the first 20 weeks) of pregnancy. It often goes away once a woman is in her second half of pregnancy. However, sometimes hyperemesis continues through an entire pregnancy.   CAUSES   The cause of this condition is not completely known but is thought to be related to changes in the body's hormones when pregnant. It could be from the high level of the pregnancy hormone or an increase in estrogen in the body.   SIGNS AND SYMPTOMS   · Severe nausea and vomiting.  · Nausea that does not go away.  · Vomiting that does not allow you to keep any food down.  · Weight loss and body fluid loss (dehydration).  · Having no desire to eat or not liking food you have previously enjoyed.  DIAGNOSIS   Your health care provider will do a physical exam and ask you about your symptoms. He or she may also order blood tests and urine tests to make sure something else is not causing the problem.   TREATMENT   You may only need medicine to control the problem. If medicines do not control the nausea and vomiting, you will be treated in the hospital to prevent dehydration, increased acid in the blood (acidosis), weight loss, and changes in the electrolytes in your body that may harm the unborn baby (fetus). You may need IV fluids.   HOME CARE INSTRUCTIONS   · Only take over-the-counter or prescription medicines as directed by your health care provider.  · Try eating a couple of dry crackers or toast in the morning before getting out of bed.  · Avoid foods and smells that upset your stomach.  · Avoid fatty and spicy foods.  · Eat 5 6 small meals a day.  · Do not drink when eating meals. Drink between  meals.  · For snacks, eat high-protein foods, such as cheese.  · Eat or suck on things that have ginger in them. Ginger helps nausea.  · Avoid food preparation. The smell of food can spoil your appetite.  · Avoid iron pills and iron in your multivitamins until after 3 4 months of being pregnant. However, consult with your health care provider before stopping any prescribed iron pills.  SEEK MEDICAL CARE IF:   · Your abdominal pain increases.  · You have a severe headache.  · You have vision problems.  · You are losing weight.  SEEK IMMEDIATE MEDICAL CARE IF:   · You are unable to keep fluids down.  · You vomit blood.  · You have constant nausea and vomiting.  · You have excessive weakness.  · You have extreme thirst.  · You have dizziness or fainting.  · You have a fever or persistent symptoms for more than 2 3 days.  · You have a fever and your symptoms suddenly get worse.  MAKE SURE YOU:   · Understand these instructions.  · Will watch your condition.  · Will get help right away if you are not doing well or get worse.  Document Released: 06/21/2005 Document Revised: 04/11/2013 Document Reviewed: 01/31/2013  ExitCare® Patient Information ©2014 [employer].

## 2013-10-17 NOTE — Progress Notes (Signed)
   Subjective:    Claire Curtis is a [id number] 538w4d being seen today for her first obstetrical visit.  Her obstetrical history is significant for N/V and ptyalism. . Patient does intend to breast feed. Pregnancy history fully reviewed.  Patient reports vomiting.  Filed Vitals:   10/17/13 0843  BP: 125/77  Temp: 98.6 F (37 C)  Weight: 133 lb 9.6 oz (60.601 kg)    HISTORY: OB History  Gravida Para Term Preterm AB SAB TAB Ectopic Multiple Living  6 1 1  4  4   1     # Outcome Date GA Lbr Len/2nd Weight Sex Delivery Anes PTL Lv  6 CUR           5 TRM 05/19/06 645w0d   M CS EPI  Y     Comments: decels  4 TAB           3 TAB           2 TAB           1 TAB              Past Medical History  Diagnosis Date  . Herpes     last outbreak, last year  . Warts, genital   . Vaginal Pap smear, abnormal    Past Surgical History  Procedure Laterality Date  . Cesarean section     No family history on file.   Exam    Uterus:     Pelvic Exam:    Perineum: Normal Perineum   Vulva: normal, Bartholin's, Urethra, Skene's normal   Vagina:  normal mucosa, normal discharge       Cervix: no bleeding following Pap and no cervical motion tenderness   Adnexa: no mass, fullness, tenderness   Bony Pelvis: average  System: Breast:  normal appearance, no masses or tenderness   Skin: normal coloration and turgor, no rashes    Neurologic: oriented, grossly non-focal   Extremities: no deformities, ROM of all joints is normal   HEENT PERRLA and extra ocular movement intact   Mouth/Teeth mucous membranes moist, pharynx normal without lesions and dental hygiene good   Neck supple and no masses   Cardiovascular: regular rate and rhythm, no murmurs or gallops   Respiratory:  appears well, vitals normal, no respiratory distress, acyanotic, normal RR, ear and throat exam is normal, neck free of mass or lymphadenopathy, chest clear, no wheezing, crepitations, rhonchi, normal symmetric air entry    Abdomen: soft, non-tender; bowel sounds normal; no masses,  no organomegaly   Urinary: urethral meatus normal      Assessment:    Pregnancy: [id number] Patient Active Problem List   Diagnosis Date Noted  . Supervision of normal subsequent pregnancy 10/17/2013  . History of cesarean section 10/17/2013  . Nausea and vomiting in pregnancy prior to [redacted] weeks gestation 10/17/2013        Plan:     Initial labs drawn. Prenatal vitamins. Problem list reviewed and updated. Genetic Screening discussed Quad Screen: requested.  Ultrasound discussed; fetal survey: requested.  Follow up in 4 weeks. 50% of 30 min visit spent on counseling and coordination of care.  Refills on antiemetics per request   Claire Curtis C Josselyn Harkins 10/17/2013

## 2013-10-24 ENCOUNTER — Encounter: Payer: Self-pay | Admitting: *Deleted

## 2013-11-14 ENCOUNTER — Encounter: Payer: 59 | Admitting: Advanced Practice Midwife

## 2013-11-14 ENCOUNTER — Encounter: Payer: Self-pay | Admitting: Advanced Practice Midwife

## 2013-11-19 ENCOUNTER — Encounter: Payer: Self-pay | Admitting: General Practice

## 2013-11-30 ENCOUNTER — Encounter: Payer: Self-pay | Admitting: General Practice

## 2013-12-25 ENCOUNTER — Ambulatory Visit (HOSPITAL_COMMUNITY): Payer: 59

## 2014-01-29 ENCOUNTER — Encounter (HOSPITAL_COMMUNITY): Payer: Self-pay | Admitting: Family Medicine

## 2014-01-29 ENCOUNTER — Emergency Department (INDEPENDENT_AMBULATORY_CARE_PROVIDER_SITE_OTHER)
Admission: EM | Admit: 2014-01-29 | Discharge: 2014-01-29 | Disposition: A | Discharge status: Home / Self Care | Payer: 59 | Source: Home / Self Care | Attending: Family Medicine | Admitting: Family Medicine

## 2014-01-29 ENCOUNTER — Other Ambulatory Visit (HOSPITAL_COMMUNITY)
Admission: RE | Admit: 2014-01-29 | Discharge: 2014-01-29 | Disposition: A | Discharge status: Home / Self Care | Payer: 59 | Source: Ambulatory Visit | Attending: Family Medicine | Admitting: Family Medicine

## 2014-01-29 DIAGNOSIS — N760 Acute vaginitis: Secondary | ICD-10-CM

## 2014-01-29 DIAGNOSIS — Z113 Encounter for screening for infections with a predominantly sexual mode of transmission: Secondary | ICD-10-CM | POA: Insufficient documentation

## 2014-01-29 LAB — POCT URINALYSIS DIP (DEVICE)
Bilirubin Urine: NEGATIVE
Glucose, UA: NEGATIVE mg/dL
Ketones, ur: NEGATIVE mg/dL
NITRITE: NEGATIVE
PROTEIN: 100 mg/dL — AB
SPECIFIC GRAVITY, URINE: 1.02 (ref 1.005–1.030)
UROBILINOGEN UA: 0.2 mg/dL (ref 0.0–1.0)
pH: 7 (ref 5.0–8.0)

## 2014-01-29 LAB — POCT PREGNANCY, URINE: PREG TEST UR: NEGATIVE

## 2014-01-29 NOTE — ED Notes (Signed)
Denies itching, denies burning.  Partner says he is not feeling right.  Patient reports having minimal discharge prior to starting her period (normal for her).  Patient says slight odor initially (normal for her).  Patient reports she has not had anything that made her feel concerned for std

## 2014-01-29 NOTE — Discharge Instructions (Signed)
Your symptoms may be due to an infection.  We will call you with results from your lab work today and prescribe the appropriate medications Please use ibuprofen as needed for discomfort.  There is a chance that you could have caught something from your partner in the last few days. If your studies are normal then you do not have any STD.

## 2014-01-29 NOTE — ED Provider Notes (Signed)
CSN: [id number]     Arrival date & time 01/29/14  1803 History   First MD Initiated Contact with Patient 01/29/14 1927     Chief Complaint  Patient presents with  . Exposure to STD   (Consider location/radiation/quality/duration/timing/severity/associated sxs/prior Treatment) HPI  Vaginal discharge started 7 days ago. Associated w/ odor. Denies abd pain, dysuria, fevers, frequency. Sex female partner w/o condom last week. Partner having dysuria and frequency (DM ???). Currently on period. Pt feels her symptioms may simply be from her period but concerned that her sexual partner may have something that he is not telling her about.    Past Medical History  Diagnosis Date  . Herpes     last outbreak, last year  . Warts, genital   . Vaginal Pap smear, abnormal    Past Surgical History  Procedure Laterality Date  . Cesarean section     No family history on file. History  Substance Use Topics  . Smoking status: Never Smoker   . Smokeless tobacco: Never Used  . Alcohol Use: No   OB History   Grav Para Term Preterm Abortions TAB SAB Ect Mult Living   6 1 1  4 4    1      Review of Systems Per HPI with all other pertinent systems negative.   Allergies  Review of patient's allergies indicates no known allergies.  Home Medications   Prior to Admission medications   Medication Sig Start Date End Date Taking? Authorizing Provider  glycopyrrolate (ROBINUL) 2 MG tablet Take 1 tablet (2 mg total) by mouth 3 (three) times daily as needed. 10/09/13   Misty StanleyLisa A Leftwich-Kirby, CNM  nitrofurantoin, macrocrystal-monohydrate, (MACROBID) 100 MG capsule Take 1 capsule (100 mg total) by mouth 2 (two) times daily. 09/24/13   Marlis EdelsonWalidah N Karim, CNM  ondansetron (ZOFRAN ODT) 4 MG disintegrating tablet Take 1 tablet (4 mg total) by mouth every 6 (six) hours as needed for nausea. 10/17/13   Deirdre C Poe, CNM  ondansetron (ZOFRAN) 4 MG tablet Take 1 tablet (4 mg total) by mouth every 6 (six) hours. 09/28/13    Deirdre Colin Mulders Poe, CNM  promethazine (PHENERGAN) 12.5 MG tablet Take 1 tablet (12.5 mg total) by mouth every 6 (six) hours as needed for nausea or vomiting. 10/17/13   Deirdre Colin Mulders Poe, CNM  promethazine (PHENERGAN) 25 MG tablet Take 0.5-1 tablets (12.5-25 mg total) by mouth every 6 (six) hours as needed for nausea or vomiting. 10/09/13   Wilmer FloorLisa A Leftwich-Kirby, CNM   BP 130/91  Pulse 80  Temp(Src) 98.6 F (37 C) (Oral)  Resp 14  SpO2 100%  LMP 01/27/2014  Breastfeeding? Unknown Physical Exam  Constitutional: She is oriented to person, place, and time. She appears well-developed and well-nourished. No distress.  HENT:  Head: Normocephalic and atraumatic.  Eyes: EOM are normal. Pupils are equal, round, and reactive to light.  Neck: Normal range of motion.  Pulmonary/Chest: Effort normal. No respiratory distress.  Abdominal: Soft. She exhibits no distension.  Genitourinary:  Vaginal walls well rugated. Heavy bleeding, w/o purulent or mucus discharge. Cervix nml and w/o motion tenderness  Musculoskeletal: Normal range of motion. She exhibits no edema and no tenderness.  Neurological: She is alert and oriented to person, place, and time. She exhibits normal muscle tone.  Skin: Skin is warm. She is not diaphoretic.  Psychiatric: She has a normal mood and affect. Her behavior is normal. Judgment and thought content normal.    ED Course  Procedures (including critical  care time) Labs Review Labs Reviewed  POCT URINALYSIS DIP (DEVICE) - Abnormal; Notable for the following:    Hgb urine dipstick LARGE (*)    Protein, ur 100 (*)    Leukocytes, UA TRACE (*)    All other components within normal limits  POCT PREGNANCY, URINE    Imaging Review No results found.   MDM   1. Vaginitis    BF w/ symptoms of either uti, STD, or DM polyuria. Unprotected intercourse x1. Wet prep, Gc, Chl. Pt to be called w/ results Ibuprofen prn Precautions given and all questions answered   Shelly Flatten,  MD Family Medicine 01/29/2014, 7:49 PM      Ozella Rocks, MD 01/29/14 1949

## 2014-01-31 ENCOUNTER — Telehealth (HOSPITAL_COMMUNITY): Payer: Self-pay | Admitting: Family Medicine

## 2014-01-31 MED ORDER — METRONIDAZOLE 500 MG PO TABS: 500.0000 mg | ORAL_TABLET | Freq: Two times a day (BID) | ORAL | Status: DC

## 2014-01-31 NOTE — ED Notes (Signed)
BV positive .  Flagyl called in.   Rodolph BongEvan S Corey, MD 01/31/14 [phone]

## 2014-02-01 ENCOUNTER — Telehealth (HOSPITAL_COMMUNITY): Payer: Self-pay | Admitting: *Deleted

## 2014-02-01 LAB — GONOCOCCUS CULTURE: Culture: NO GROWTH

## 2014-02-01 NOTE — ED Notes (Signed)
GC/Chlamydia neg., Affirm: Candida and Trich neg., Gardnerella pos., GC throat culture neg.  Order for Flagyl done by Dr. Denyse Amassorey.  I called pt. Pt. verified x 2 and given results.  Pt. instructed no sex until partners results are done.  Pt. told she needs Flagyl for bacterial vaginosis.  Pt. Told where to pick up her Rx.   Pt. instructed to no alcohol while taking this medication.  Pt. voiced understanding. Claire Curtis, Claire Curtis M 02/01/2014

## 2014-05-03 ENCOUNTER — Encounter: Payer: Self-pay | Admitting: General Practice

## 2014-05-06 ENCOUNTER — Encounter (HOSPITAL_COMMUNITY): Payer: Self-pay | Admitting: Family Medicine

## 2016-12-25 ENCOUNTER — Ambulatory Visit (HOSPITAL_COMMUNITY)
Admission: EM | Admit: 2016-12-25 | Discharge: 2016-12-25 | Disposition: A | Discharge status: Home / Self Care | Payer: BC Managed Care – PPO

## 2016-12-25 ENCOUNTER — Encounter (HOSPITAL_COMMUNITY): Payer: Self-pay | Admitting: Emergency Medicine

## 2016-12-25 DIAGNOSIS — R197 Diarrhea, unspecified: Secondary | ICD-10-CM

## 2016-12-25 DIAGNOSIS — R112 Nausea with vomiting, unspecified: Secondary | ICD-10-CM

## 2016-12-25 DIAGNOSIS — Z3490 Encounter for supervision of normal pregnancy, unspecified, unspecified trimester: Secondary | ICD-10-CM

## 2016-12-25 DIAGNOSIS — Z98891 History of uterine scar from previous surgery: Secondary | ICD-10-CM

## 2016-12-25 DIAGNOSIS — Z3480 Encounter for supervision of other normal pregnancy, unspecified trimester: Secondary | ICD-10-CM

## 2016-12-25 DIAGNOSIS — Z23 Encounter for immunization: Secondary | ICD-10-CM

## 2016-12-25 DIAGNOSIS — O219 Vomiting of pregnancy, unspecified: Secondary | ICD-10-CM

## 2016-12-25 MED ORDER — ONDANSETRON 4 MG PO TBDP
4.0000 mg | ORAL_TABLET | Freq: Once | ORAL | Status: AC
  Administered 2016-12-25: 4 mg via ORAL

## 2016-12-25 MED ORDER — ONDANSETRON HCL 4 MG PO TABS: 4.0000 mg | ORAL_TABLET | Freq: Four times a day (QID) | ORAL | 0 refills | Status: DC

## 2016-12-25 MED ORDER — ONDANSETRON 4 MG PO TBDP
ORAL_TABLET | ORAL | Status: AC
  Filled 2016-12-25: qty 1

## 2016-12-25 NOTE — ED Notes (Signed)
All documentation stating Marylouise StacksScarlett Smith, RN as user done by Charlesetta Garibaldihristina Kanai Berrios, RN (error).

## 2016-12-25 NOTE — ED Triage Notes (Signed)
Reports starting with diarrhea and some episodes of vomiting yesterday.  Went to work this AM; ate a sausage biscuit & vomited again.

## 2016-12-25 NOTE — ED Provider Notes (Signed)
CSN: [id number]     Arrival date & time 12/25/16  1754 History   None    Chief Complaint  Patient presents with  . Diarrhea  . Emesis   (Consider location/radiation/quality/duration/timing/severity/associated sxs/prior Treatment) 31 yo female with 3 day history of N/V/D. She states she has been having lots of loose stools and has had 2 episodes of vomiting. She has been eating out a lot, and had more alcohol than she used to 4-5 days ago, which she thought could be the cause of diarrhea. Her last episode of vomiting was this morning, when she had a sausage biscuit. She has since had water and crackers and is able to keep it down. She started her menstrual cycle yesterday, and has had some lower abdominal cramping associated to her cycle that is normal to her. Denies abdominal pain prior to onset of cycle. Denies fever, chills, night sweats. Denies urinary frequency, dysuria, hematuria. Denies URI symptoms. No sick contact. Denies recent antibiotic use.       Past Medical History:  Diagnosis Date  . Herpes    last outbreak, last year  . Vaginal Pap smear, abnormal   . Warts, genital    Past Surgical History:  Procedure Laterality Date  . CESAREAN SECTION     No family history on file. Social History  Substance Use Topics  . Smoking status: Never Smoker  . Smokeless tobacco: Never Used  . Alcohol use Yes     Comment: occasionally   OB History    Gravida Para Term Preterm AB Living   6 1 1   4 1    SAB TAB Ectopic Multiple Live Births     4     1     Review of Systems  Constitutional: Negative for chills, diaphoresis, fatigue and fever.  HENT: Negative for congestion, ear discharge, ear pain, facial swelling, postnasal drip, rhinorrhea, sinus pain, sinus pressure, sneezing and sore throat.   Eyes: Negative for pain, discharge and itching.  Respiratory: Negative for chest tightness, shortness of breath and wheezing.   Cardiovascular: Negative for chest pain and palpitations.   Gastrointestinal: Positive for abdominal pain, diarrhea, nausea and vomiting. Negative for abdominal distention, blood in stool and constipation.  Genitourinary: Negative for decreased urine volume, difficulty urinating, dysuria, flank pain, frequency, hematuria, menstrual problem, pelvic pain and urgency.    Allergies  Patient has no known allergies.  Home Medications   Prior to Admission medications   Medication Sig Start Date End Date Taking? Authorizing Provider  Prenatal Vit-Fe Fumarate-FA (PRENATAL VITAMIN PO) Take by mouth.   Yes [provider]  ondansetron (ZOFRAN) 4 MG tablet Take 1 tablet (4 mg total) by mouth every 6 (six) hours. 12/25/16   Belinda Fisher, PA-C   Meds Ordered and Administered this Visit   Medications  ondansetron (ZOFRAN-ODT) disintegrating tablet 4 mg (4 mg Oral Given 12/25/16 1816)    BP 126/71   Pulse 81   Temp 98.4 F (36.9 C) (Oral)   Resp 16   LMP 12/24/2016 (Exact Date)   SpO2 100%   Breastfeeding? No  No data found.   Physical Exam  Constitutional: She is oriented to person, place, and time. She appears well-developed and well-nourished. No distress.  HENT:  Head: Normocephalic and atraumatic.  Mouth/Throat: Uvula is midline, oropharynx is clear and moist and mucous membranes are normal.  Eyes: Conjunctivae are normal. Pupils are equal, round, and reactive to light.  Cardiovascular: Normal rate and regular rhythm.  Pulmonary/Chest: Effort normal and breath sounds normal.  Abdominal: Soft. Bowel sounds are normal. She exhibits no distension and no mass. There is tenderness (of the lower abdomen, consistent with her menstrual cramps). There is no rebound and no guarding.  Neurological: She is alert and oriented to person, place, and time.  Skin: Skin is warm and dry. Capillary refill takes less than 2 seconds.  Psychiatric: She has a normal mood and affect. Her behavior is normal. Judgment normal.    Urgent Care Course      Procedures (including critical care time)  Labs Review Labs Reviewed - No data to display  Imaging Review No results found.    MDM   1. Nausea vomiting and diarrhea  1. Discussed with patient with her history and exam, antibiotic is not needed. Patient to take Zofran 4mg  Q6H PRN for nausea. Patient to keep well hydrated. Instructions and resources on bland diet given to patient. To eat as tolerated. Avoid alcohol, spicy and fatty foods.    Belinda FisherYu, Lateef Juncaj V, PA-C 12/25/16 1919

## 2018-03-17 ENCOUNTER — Emergency Department: Payer: BC Managed Care – PPO | Admitting: Anesthesiology

## 2018-03-17 ENCOUNTER — Encounter: Payer: Self-pay | Admitting: Emergency Medicine

## 2018-03-17 ENCOUNTER — Other Ambulatory Visit: Payer: Self-pay

## 2018-03-17 ENCOUNTER — Observation Stay
Admission: EM | Admit: 2018-03-17 | Discharge: 2018-03-18 | Disposition: A | Discharge status: Home / Self Care | Payer: BC Managed Care – PPO | Attending: Surgery | Admitting: Surgery

## 2018-03-17 ENCOUNTER — Encounter
Admission: EM | Disposition: A | Discharge status: Home / Self Care | Payer: Self-pay | Source: Home / Self Care | Attending: Emergency Medicine

## 2018-03-17 ENCOUNTER — Emergency Department: Payer: BC Managed Care – PPO

## 2018-03-17 DIAGNOSIS — K3580 Unspecified acute appendicitis: Secondary | ICD-10-CM | POA: Diagnosis not present

## 2018-03-17 DIAGNOSIS — K429 Umbilical hernia without obstruction or gangrene: Secondary | ICD-10-CM | POA: Insufficient documentation

## 2018-03-17 HISTORY — PX: LAPAROSCOPIC APPENDECTOMY: SHX408

## 2018-03-17 LAB — URINALYSIS, COMPLETE (UACMP) WITH MICROSCOPIC
Bacteria, UA: NONE SEEN
Bilirubin Urine: NEGATIVE
GLUCOSE, UA: NEGATIVE mg/dL
Ketones, ur: 20 mg/dL — AB
Leukocytes, UA: NEGATIVE
NITRITE: NEGATIVE
PH: 7 (ref 5.0–8.0)
PROTEIN: NEGATIVE mg/dL
Specific Gravity, Urine: 1.018 (ref 1.005–1.030)

## 2018-03-17 LAB — COMPREHENSIVE METABOLIC PANEL
ALBUMIN: 4.6 g/dL (ref 3.5–5.0)
ALK PHOS: 44 U/L (ref 38–126)
ALT: 16 U/L (ref 0–44)
AST: 23 U/L (ref 15–41)
Anion gap: 6 (ref 5–15)
BILIRUBIN TOTAL: 0.8 mg/dL (ref 0.3–1.2)
BUN: 11 mg/dL (ref 6–20)
CALCIUM: 9.1 mg/dL (ref 8.9–10.3)
CO2: 23 mmol/L (ref 22–32)
Chloride: 106 mmol/L (ref 98–111)
Creatinine, Ser: 0.61 mg/dL (ref 0.44–1.00)
GFR calc Af Amer: >60 mL/min (ref >60)
GFR calc non Af Amer: >60 mL/min (ref >60)
Glucose, Bld: 118 mg/dL — ABNORMAL HIGH (ref 70–99)
Potassium: 4 mmol/L (ref 3.5–5.1)
Sodium: 135 mmol/L (ref 135–145)
TOTAL PROTEIN: 7.6 g/dL (ref 6.5–8.1)

## 2018-03-17 LAB — CBC
HCT: 39.3 % (ref 35.0–47.0)
HEMOGLOBIN: 13 g/dL (ref 12.0–16.0)
MCH: 28.4 pg (ref 26.0–34.0)
MCHC: 33.1 g/dL (ref 32.0–36.0)
MCV: 85.9 fL (ref 80.0–100.0)
Platelets: 335 10*3/uL (ref 150–440)
RBC: 4.58 MIL/uL (ref 3.80–5.20)
RDW: 12.5 % (ref 11.5–14.5)
WBC: 18.8 10*3/uL — ABNORMAL HIGH (ref 3.6–11.0)

## 2018-03-17 LAB — POCT PREGNANCY, URINE: Preg Test, Ur: NEGATIVE

## 2018-03-17 LAB — LIPASE, BLOOD: Lipase: 30 U/L (ref 11–51)

## 2018-03-17 LAB — HCG, QUANTITATIVE, PREGNANCY: hCG, Beta Chain, Quant, S: <1 m[IU]/mL (ref <5)

## 2018-03-17 SURGERY — APPENDECTOMY, LAPAROSCOPIC
Anesthesia: General | Site: Abdomen

## 2018-03-17 MED ORDER — FENTANYL CITRATE (PF) 100 MCG/2ML IJ SOLN
INTRAMUSCULAR | Status: AC
  Filled 2018-03-17: qty 2

## 2018-03-17 MED ORDER — DEXAMETHASONE SODIUM PHOSPHATE 10 MG/ML IJ SOLN
INTRAMUSCULAR | Status: DC | PRN
  Administered 2018-03-17: 5 mg via INTRAVENOUS

## 2018-03-17 MED ORDER — PROMETHAZINE HCL 25 MG/ML IJ SOLN: 6.2500 mg | INTRAMUSCULAR | Status: DC | PRN

## 2018-03-17 MED ORDER — OXYCODONE HCL 5 MG/5ML PO SOLN: 5.0000 mg | Freq: Once | ORAL | Status: DC | PRN

## 2018-03-17 MED ORDER — PROPOFOL 10 MG/ML IV BOLUS
INTRAVENOUS | Status: AC
Start: 2018-03-17 — End: ?
  Filled 2018-03-17: qty 20

## 2018-03-17 MED ORDER — ACETAMINOPHEN 10 MG/ML IV SOLN
INTRAVENOUS | Status: AC
  Filled 2018-03-17: qty 100

## 2018-03-17 MED ORDER — IOPAMIDOL (ISOVUE-300) INJECTION 61%
100.0000 mL | Freq: Once | INTRAVENOUS | Status: AC | PRN
  Administered 2018-03-17: 100 mL via INTRAVENOUS
  Filled 2018-03-17: qty 100

## 2018-03-17 MED ORDER — BUPIVACAINE-EPINEPHRINE (PF) 0.25% -1:200000 IJ SOLN
INTRAMUSCULAR | Status: AC
  Filled 2018-03-17: qty 30

## 2018-03-17 MED ORDER — BUPIVACAINE-EPINEPHRINE (PF) 0.25% -1:200000 IJ SOLN
INTRAMUSCULAR | Status: DC | PRN
  Administered 2018-03-17: 30 mL via PERINEURAL

## 2018-03-17 MED ORDER — OXYCODONE HCL 5 MG PO TABS: 5.0000 mg | ORAL_TABLET | Freq: Once | ORAL | Status: DC | PRN

## 2018-03-17 MED ORDER — ONDANSETRON HCL 4 MG PO TABS: 4.0000 mg | ORAL_TABLET | Freq: Four times a day (QID) | ORAL | Status: DC | PRN

## 2018-03-17 MED ORDER — KETOROLAC TROMETHAMINE 30 MG/ML IJ SOLN
INTRAMUSCULAR | Status: DC | PRN
  Administered 2018-03-17: 30 mg via INTRAVENOUS

## 2018-03-17 MED ORDER — LIDOCAINE HCL (CARDIAC) PF 100 MG/5ML IV SOSY
PREFILLED_SYRINGE | INTRAVENOUS | Status: DC | PRN
  Administered 2018-03-17: 60 mg via INTRAVENOUS

## 2018-03-17 MED ORDER — HYDROCODONE-ACETAMINOPHEN 5-325 MG PO TABS
1.0000 | ORAL_TABLET | ORAL | Status: DC | PRN
  Administered 2018-03-18 (×2): 1 via ORAL
  Filled 2018-03-17: qty 1
  Filled 2018-03-17: qty 2

## 2018-03-17 MED ORDER — PROPOFOL 500 MG/50ML IV EMUL
INTRAVENOUS | Status: DC | PRN
  Administered 2018-03-17: 175 ug/kg/min via INTRAVENOUS

## 2018-03-17 MED ORDER — ONDANSETRON HCL 4 MG/2ML IJ SOLN
INTRAMUSCULAR | Status: DC | PRN
  Administered 2018-03-17: 4 mg via INTRAVENOUS

## 2018-03-17 MED ORDER — ACETAMINOPHEN 10 MG/ML IV SOLN
INTRAVENOUS | Status: DC | PRN
  Administered 2018-03-17: 1000 mg via INTRAVENOUS

## 2018-03-17 MED ORDER — FENTANYL CITRATE (PF) 100 MCG/2ML IJ SOLN
INTRAMUSCULAR | Status: DC | PRN
  Administered 2018-03-17: 100 ug via INTRAVENOUS

## 2018-03-17 MED ORDER — PIPERACILLIN-TAZOBACTAM 3.375 G IVPB 30 MIN
3.3750 g | Freq: Once | INTRAVENOUS | Status: AC
  Administered 2018-03-17: 3.375 g via INTRAVENOUS
  Filled 2018-03-17 (×2): qty 50

## 2018-03-17 MED ORDER — KETOROLAC TROMETHAMINE 30 MG/ML IJ SOLN
15.0000 mg | Freq: Once | INTRAMUSCULAR | Status: AC
  Administered 2018-03-17: 15 mg via INTRAVENOUS
  Filled 2018-03-17: qty 1

## 2018-03-17 MED ORDER — HYDROMORPHONE HCL 1 MG/ML IJ SOLN
0.5000 mg | INTRAMUSCULAR | Status: DC | PRN
  Administered 2018-03-17 – 2018-03-18 (×4): 0.5 mg via INTRAVENOUS
  Filled 2018-03-17 (×4): qty 0.5

## 2018-03-17 MED ORDER — FENTANYL CITRATE (PF) 100 MCG/2ML IJ SOLN
INTRAMUSCULAR | Status: AC
  Administered 2018-03-17: 25 ug via INTRAVENOUS
  Filled 2018-03-17: qty 2

## 2018-03-17 MED ORDER — SUGAMMADEX SODIUM 500 MG/5ML IV SOLN
INTRAVENOUS | Status: DC | PRN
  Administered 2018-03-17: 120 mg via INTRAVENOUS

## 2018-03-17 MED ORDER — ONDANSETRON HCL 4 MG/2ML IJ SOLN
4.0000 mg | Freq: Once | INTRAMUSCULAR | Status: AC
  Administered 2018-03-17: 4 mg via INTRAVENOUS
  Filled 2018-03-17: qty 2

## 2018-03-17 MED ORDER — SCOPOLAMINE 1 MG/3DAYS TD PT72
MEDICATED_PATCH | TRANSDERMAL | Status: AC
  Administered 2018-03-17: 1.5 mg via TRANSDERMAL
  Filled 2018-03-17: qty 1

## 2018-03-17 MED ORDER — DEXTROSE-NACL 5-0.9 % IV SOLN
INTRAVENOUS | Status: DC
  Administered 2018-03-17 – 2018-03-18 (×3): via INTRAVENOUS

## 2018-03-17 MED ORDER — LACTATED RINGERS IV SOLN
INTRAVENOUS | Status: DC | PRN
  Administered 2018-03-17: 19:00:00 via INTRAVENOUS

## 2018-03-17 MED ORDER — SUCCINYLCHOLINE CHLORIDE 20 MG/ML IJ SOLN
INTRAMUSCULAR | Status: DC | PRN
  Administered 2018-03-17: 60 mg via INTRAVENOUS

## 2018-03-17 MED ORDER — FENTANYL CITRATE (PF) 100 MCG/2ML IJ SOLN
25.0000 ug | INTRAMUSCULAR | Status: DC | PRN
  Administered 2018-03-17 (×4): 25 ug via INTRAVENOUS

## 2018-03-17 MED ORDER — ONDANSETRON HCL 4 MG/2ML IJ SOLN
4.0000 mg | Freq: Four times a day (QID) | INTRAMUSCULAR | Status: DC | PRN
  Administered 2018-03-17: 4 mg via INTRAVENOUS
  Filled 2018-03-17: qty 2

## 2018-03-17 MED ORDER — MIDAZOLAM HCL 2 MG/2ML IJ SOLN
INTRAMUSCULAR | Status: DC | PRN
  Administered 2018-03-17: 2 mg via INTRAVENOUS

## 2018-03-17 MED ORDER — MIDAZOLAM HCL 2 MG/2ML IJ SOLN
INTRAMUSCULAR | Status: AC
  Filled 2018-03-17: qty 2

## 2018-03-17 MED ORDER — HEPARIN SODIUM (PORCINE) 5000 UNIT/ML IJ SOLN
5000.0000 [IU] | Freq: Three times a day (TID) | INTRAMUSCULAR | Status: DC
  Administered 2018-03-17 – 2018-03-18 (×2): 5000 [IU] via SUBCUTANEOUS
  Filled 2018-03-17 (×2): qty 1

## 2018-03-17 MED ORDER — SCOPOLAMINE 1 MG/3DAYS TD PT72
1.0000 | MEDICATED_PATCH | Freq: Once | TRANSDERMAL | Status: DC
  Administered 2018-03-17: 1.5 mg via TRANSDERMAL

## 2018-03-17 MED ORDER — PROPOFOL 10 MG/ML IV BOLUS
INTRAVENOUS | Status: DC | PRN
  Administered 2018-03-17: 70 mg via INTRAVENOUS
  Administered 2018-03-17: 130 mg via INTRAVENOUS

## 2018-03-17 SURGICAL SUPPLY — 47 items
ADH LQ OCL WTPRF AMP STRL LF (MISCELLANEOUS) ×1
ADHESIVE MASTISOL STRL (MISCELLANEOUS) ×2 IMPLANT
APPLIER CLIP ROT 10 11.4 M/L (STAPLE) ×2
APPLIER CLIP UNV 5X34 EPIX (ENDOMECHANICALS) IMPLANT
APR CLP MED LRG 11.4X10 (STAPLE) ×1
APR XCLPCLP 20M/L UNV 34X5 (ENDOMECHANICALS)
BAG SPEC RTRVL LRG 6X4 10 (ENDOMECHANICALS) ×1
BLADE SURG SZ11 CARB STEEL (BLADE) ×2 IMPLANT
CANISTER SUCT 3000ML PPV (MISCELLANEOUS) ×2 IMPLANT
CHLORAPREP W/TINT 26ML (MISCELLANEOUS) ×2 IMPLANT
CLIP APPLIE ROT 10 11.4 M/L (STAPLE) ×1 IMPLANT
CUTTER FLEX LINEAR 45M (STAPLE) ×2 IMPLANT
DEVICE TROCAR PUNCTURE CLOSURE (ENDOMECHANICALS) ×2 IMPLANT
ELECT REM PT RETURN 9FT ADLT (ELECTROSURGICAL) ×2
ELECTRODE REM PT RTRN 9FT ADLT (ELECTROSURGICAL) ×1 IMPLANT
GLOVE BIO SURGEON STRL SZ8 (GLOVE) ×2 IMPLANT
GOWN STRL REUS W/ TWL LRG LVL3 (GOWN DISPOSABLE) ×2 IMPLANT
GOWN STRL REUS W/TWL LRG LVL3 (GOWN DISPOSABLE) ×4
IRRIGATION STRYKERFLOW (MISCELLANEOUS) ×1 IMPLANT
IRRIGATOR STRYKERFLOW (MISCELLANEOUS) ×2
KIT TURNOVER KIT A (KITS) ×2 IMPLANT
LABEL OR SOLS (LABEL) ×2 IMPLANT
NEEDLE HYPO 22GX1.5 SAFETY (NEEDLE) ×2 IMPLANT
NEEDLE VERESS 14GA 120MM (NEEDLE) ×2 IMPLANT
NS IRRIG 500ML POUR BTL (IV SOLUTION) ×2 IMPLANT
PACK LAP CHOLECYSTECTOMY (MISCELLANEOUS) ×2 IMPLANT
POUCH SPECIMEN RETRIEVAL 10MM (ENDOMECHANICALS) ×2 IMPLANT
RELOAD 45 VASCULAR/THIN (ENDOMECHANICALS) ×2 IMPLANT
RELOAD STAPLE 45 2.5 WHT GRN (ENDOMECHANICALS) ×1 IMPLANT
RELOAD STAPLE 45 3.5 BLU ETS (ENDOMECHANICALS) ×1 IMPLANT
RELOAD STAPLE TA45 3.5 REG BLU (ENDOMECHANICALS) ×2 IMPLANT
SCISSORS METZENBAUM CVD 33 (INSTRUMENTS) IMPLANT
SLEEVE ENDOPATH XCEL 5M (ENDOMECHANICALS) ×2 IMPLANT
SOL .9 NS 3000ML IRR  AL (IV SOLUTION) ×1
SOL .9 NS 3000ML IRR AL (IV SOLUTION) ×1
SOL .9 NS 3000ML IRR UROMATIC (IV SOLUTION) ×1 IMPLANT
SPONGE GAUZE 2X2 8PLY STRL LF (GAUZE/BANDAGES/DRESSINGS) ×6 IMPLANT
SPONGE LAP 18X18 RF (DISPOSABLE) ×2 IMPLANT
STRIP CLOSURE SKIN 1/2X4 (GAUZE/BANDAGES/DRESSINGS) ×2 IMPLANT
SUT MNCRL 4-0 (SUTURE) ×2
SUT MNCRL 4-0 27XMFL (SUTURE) ×1
SUT VICRYL 0 TIES 12 18 (SUTURE) ×2 IMPLANT
SUTURE MNCRL 4-0 27XMF (SUTURE) ×1 IMPLANT
TRAY FOLEY MTR SLVR 16FR STAT (SET/KITS/TRAYS/PACK) ×2 IMPLANT
TROCAR XCEL 12X100 BLDLESS (ENDOMECHANICALS) ×2 IMPLANT
TROCAR XCEL NON-BLD 5MMX100MML (ENDOMECHANICALS) ×2 IMPLANT
TUBING INSUFFLATION (TUBING) ×2 IMPLANT

## 2018-03-17 NOTE — Anesthesia Preprocedure Evaluation (Addendum)
Anesthesia Evaluation  Patient identified by MRN, date of birth, ID band Patient awake    Reviewed: Allergy & Precautions, H&P , NPO status , Patient's Chart, lab work & pertinent test results  Airway Mallampati: I  TM Distance: >3 FB   Mouth opening: Limited Mouth Opening  Dental no notable dental hx. (+) Teeth Intact   Pulmonary neg pulmonary ROS, neg COPD,    breath sounds clear to auscultation       Cardiovascular (-) hypertensionnegative cardio ROS   Rhythm:regular Rate:Normal     Neuro/Psych negative neurological ROS  negative psych ROS   GI/Hepatic negative GI ROS, Neg liver ROS,   Endo/Other  negative endocrine ROS  Renal/GU      Musculoskeletal   Abdominal   Peds  Hematology negative hematology ROS (+)   Anesthesia Other Findings Past Medical History: No date: Herpes     Comment:  last outbreak, last year No date: Vaginal Pap smear, abnormal No date: Warts, genital  Past Surgical History: No date: CESAREAN SECTION  BMI    Body Mass Index:  23.86 kg/m      Reproductive/Obstetrics negative OB ROS                            Anesthesia Physical Anesthesia Plan  ASA: I  Anesthesia Plan: General ETT   Post-op Pain Management:    Induction:   PONV Risk Score and Plan: Ondansetron, Dexamethasone, Midazolam, Propofol infusion, TIVA and Scopolamine patch - Pre-op  Airway Management Planned:   Additional Equipment:   Intra-op Plan:   Post-operative Plan:   Informed Consent: I have reviewed the patients History and Physical, chart, labs and discussed the procedure including the risks, benefits and alternatives for the proposed anesthesia with the patient or authorized representative who has indicated his/her understanding and acceptance.   Dental Advisory Given  Plan Discussed with: Anesthesiologist, CRNA and Surgeon  Anesthesia Plan Comments:         Anesthesia Quick Evaluation

## 2018-03-17 NOTE — ED Triage Notes (Signed)
First Nurse Note:  C/O abdominal pain x 1 day, generalized, and emesis today.  Arrives via [facility].  VS WNL.

## 2018-03-17 NOTE — Plan of Care (Signed)
  Problem: Education: Goal: Knowledge of General Education information will improve Description Including pain rating scale, medication(s)/side effects and non-pharmacologic comfort measures Outcome: Progressing   Problem: Elimination: Goal: Will not experience complications related to urinary retention Outcome: Progressing   Problem: Pain Managment: Goal: General experience of comfort will improve Outcome: Progressing   Problem: Safety: Goal: Ability to remain free from injury will improve Outcome: Progressing   Problem: Clinical Measurements: Goal: Ability to maintain clinical measurements within normal limits will improve Outcome: Progressing Goal: Postoperative complications will be avoided or minimized Outcome: Progressing   Problem: Skin Integrity: Goal: Demonstration of wound healing without infection will improve Outcome: Progressing

## 2018-03-17 NOTE — ED Triage Notes (Signed)
Lower abdominal pain, nausea vomiting and diarrhea began at 8am this am.

## 2018-03-17 NOTE — Anesthesia Post-op Follow-up Note (Signed)
Anesthesia QCDR form completed.        

## 2018-03-17 NOTE — Anesthesia Procedure Notes (Signed)
Procedure Name: Intubation Performed by: Lesle Reek, CRNA Pre-anesthesia Checklist: Patient identified, Emergency Drugs available, Suction available, Patient being monitored and Timeout performed Patient Re-evaluated:Patient Re-evaluated prior to induction Oxygen Delivery Method: Circle system utilized Preoxygenation: Pre-oxygenation with 100% oxygen Induction Type: IV induction Laryngoscope Size: Mac and 3 Grade View: Grade I Tube type: Oral Tube size: 7.0 mm Number of attempts: 1 Airway Equipment and Method: Stylet Placement Confirmation: breath sounds checked- equal and bilateral,  CO2 detector,  positive ETCO2 and ETT inserted through vocal cords under direct vision Secured at: 21 cm Tube secured with: Tape

## 2018-03-17 NOTE — ED Notes (Signed)
Report given to OR nurse at this time. OR RN states that it wil be approx 45 mins before they get here.

## 2018-03-17 NOTE — Op Note (Signed)
laparascopic appendectomy   [facility] Date of operation:  03[facility]/2018  Indications: The patient presented with a history of  abdominal pain. Workup has revealed findings consistent with acute appendicitis.  Pre-operative Diagnosis: Acute appendicitis  Post-operative Diagnosis: Acute appendicitis, nonruptured  Surgeon: Adah Salvageichard E. Excell Seltzerooper, MD, FACS  Anesthesia: General with endotracheal tube  Procedure Details  The patient was seen again in the preop area. The options of surgery versus observation were reviewed with the patient and/or family. The risks of bleeding, infection, recurrence of symptoms, negative laparoscopy, potential for an open procedure, bowel injury, abscess or infection, were all reviewed as well. The patient was taken [name] and the procedure verified as laparoscopic appendectomy. A Time Out was held and the above information confirmed.  The patient was placed in the supine position and general anesthesia was induced.  Antibiotic prophylaxis was administered and VT E prophylaxis was in place. A Foley catheter was placed by the nursing staff.   The abdomen was prepped and draped in a sterile fashion. An infraumbilical incision was made. A Veress needle was placed and pneumoperitoneum was obtained. A 5 mm trocar port was placed without difficulty and the abdominal cavity was explored.  Under direct vision a 5 mm suprapubic port was placed and a 13 mm left lateral port was placed all under direct vision.  The appendix was identified and found to be acutely inflamed but it was nonruptured. The appendix was carefully dissected. The base of the appendix was dissected out and divided with a standard load Endo GIA. The mesoappendix was divided with a vascular load Endo GIA.  Lips were utilized on the staple line for minimal hemorrhage. The appendix was passed out through the left lateral port site with the aid of an Endo Catch bag.  The right lower quadrant and pelvis was then irrigated with copious amounts of normal saline which was aspirated. Inspection  failed to identify any additional bleeding and there were no signs of bowel injury. Therefore the left lateral port site was closed under direct vision utilizing an Endo Close technique with 0 Vicryl interrupted sutures, all under direct vision.   Again the right lower quadrant was inspected there was no sign of bleeding or bowel injury therefore pneumoperitoneum was released, all ports were removed and the skin incisions were approximated with subcuticular 4-0 Monocryl. Steri-Strips and Mastisol and sterile dressings were placed.  The patient tolerated the procedure well, there were no complications. The sponge lap and needle count were correct at the end of the procedure.  The patient was taken to the recovery room in stable condition to be admitted for continued care.  Findings: Acute appendicitis nonruptured  Estimated Blood Loss: Normal                  Specimens: appendix         Complications: None                  Claire Curtis E. Excell Seltzerooper MD, FACS

## 2018-03-17 NOTE — Transfer of Care (Signed)
Immediate Anesthesia Transfer of Care Note  Patient: Claire Curtis  Procedure(s) Performed: APPENDECTOMY LAPAROSCOPIC (N/A Abdomen)  Patient Location: PACU  Anesthesia Type:General  Level of Consciousness: awake, alert  and oriented  Airway & Oxygen Therapy: Patient Spontanous Breathing  Post-op Assessment: Report given to RN  Post vital signs: Reviewed and stable  Last Vitals:  Vitals Value Taken Time  BP    Temp    Pulse 111 03/17/2018  8:05 PM  Resp 32 03/17/2018  8:05 PM  SpO2 100 % 03/17/2018  8:05 PM  Vitals shown include unvalidated device data.  Last Pain:  Vitals:   03/17/18 1447  TempSrc:   PainSc: 10-Worst pain ever         Complications: No apparent anesthesia complications

## 2018-03-17 NOTE — ED Provider Notes (Signed)
[facility] Emergency Department Provider Note  ____________________________________________  Time seen: Approximately 3:01 PM  I have reviewed the triage vital signs and the nursing notes.   HISTORY  Chief Complaint Abdominal Pain   HPI Claire Curtis is a 32 y.o. female no significant past medical history who presents for evaluation of abdominal pain.  Patient is complaining of a day of constant, diffuse sharp abdominal pain worse in the periumbilical region.  She has had nausea and 2 episodes of bilious vomiting.  She reports having 2 watery bowel movements with this as well.  No fever but has had chills.  No dysuria, hematuria, vaginal bleeding, vaginal discharge.  LMP was 2 weeks ago.  No prior abdominal surgeries other than a C-section.  Currently pain is severe 10 out of 10.  Past Medical History:  Diagnosis Date  . Herpes    last outbreak, last year  . Vaginal Pap smear, abnormal   . Warts, genital     Patient Active Problem List   Diagnosis Date Noted  . Supervision of normal subsequent pregnancy 10/17/2013  . History of cesarean section 10/17/2013  . Nausea and vomiting in pregnancy prior to [redacted] weeks gestation 10/17/2013    Past Surgical History:  Procedure Laterality Date  . CESAREAN SECTION      Prior to Admission medications   Medication Sig Start Date End Date Taking? Authorizing Provider  ondansetron (ZOFRAN) 4 MG tablet Take 1 tablet (4 mg total) by mouth every 6 (six) hours. Patient not taking: Reported on 03/17/2018 12/25/16   Belinda Fisher, PA-C    Allergies Patient has no known allergies.  No family history on file.  Social History Social History   Tobacco Use  . Smoking status: Never Smoker  . Smokeless tobacco: Never Used  Substance Use Topics  . Alcohol use: Yes    Comment: occasionally  . Drug use: No    Review of Systems  Constitutional: Negative for fever. + chills Eyes: Negative for visual  changes. ENT: Negative for sore throat. Neck: No neck pain  Cardiovascular: Negative for chest pain. Respiratory: Negative for shortness of breath. Gastrointestinal: + abdominal pain, vomiting and diarrhea. Genitourinary: Negative for dysuria. Musculoskeletal: Negative for back pain. Skin: Negative for rash. Neurological: Negative for headaches, weakness or numbness. Psych: No SI or HI  ____________________________________________   PHYSICAL EXAM:  VITAL SIGNS: ED Triage Vitals  Enc Vitals Group     BP 03/17/18 1359 116/74     Pulse Rate 03/17/18 1359 82     Resp 03/17/18 1359 18     Temp 03/17/18 1359 98.5 F (36.9 C)     Temp Source 03/17/18 1359 Oral     SpO2 03/17/18 1359 100 %     Weight 03/17/18 1400 139 lb (63 kg)     Height 03/17/18 1400 5\' 4"  (1.626 m)     Head Circumference --      Peak Flow --      Pain Score 03/17/18 1400 10     Pain Loc --      Pain Edu? --      Excl. in GC? --     Constitutional: Alert and oriented, curled up in the stretcher with significant discomfort.  HEENT:      Head: Normocephalic and atraumatic.         Eyes: Conjunctivae are normal. Sclera is non-icteric.       Mouth/Throat: Mucous membranes are moist.  Neck: Supple with no signs of meningismus. Cardiovascular: Regular rate and rhythm. No murmurs, gallops, or rubs. 2+ symmetrical distal pulses are present in all extremities. No JVD. Respiratory: Normal respiratory effort. Lungs are clear to auscultation bilaterally. No wheezes, crackles, or rhonchi.  Gastrointestinal: Soft, diffuse tenderness to palpation worse in the periumbilical/ RLQ region, and non distended with positive bowel sounds. No rebound or guarding. Musculoskeletal: Nontender with normal range of motion in all extremities. No edema, cyanosis, or erythema of extremities. Neurologic: Normal speech and language. Face is symmetric. Moving all extremities. No gross focal neurologic deficits are appreciated. Skin:  Skin is warm, dry and intact. No rash noted. Psychiatric: Mood and affect are normal. Speech and behavior are normal.  ____________________________________________   LABS (all labs ordered are listed, but only abnormal results are displayed)  Labs Reviewed  COMPREHENSIVE METABOLIC PANEL - Abnormal; Notable for the following components:      Result Value   Glucose, Bld 118 (*)    All other components within normal limits  CBC - Abnormal; Notable for the following components:   WBC 18.8 (*)    All other components within normal limits  URINALYSIS, COMPLETE (UACMP) WITH MICROSCOPIC - Abnormal; Notable for the following components:   Color, Urine YELLOW (*)    APPearance CLEAR (*)    Hgb urine dipstick SMALL (*)    Ketones, ur 20 (*)    All other components within normal limits  LIPASE, BLOOD  HCG, QUANTITATIVE, PREGNANCY  POC URINE PREG, ED   ____________________________________________  EKG  none  ____________________________________________  RADIOLOGY  I have personally reviewed the images performed during this visit and I agree with the Radiologist's read.   Interpretation by Radiologist:  Ct Abdomen Pelvis W Contrast  Result Date: 03/17/2018 CLINICAL DATA:  Severe abdominal pain, mid to lower abdomen, with vomiting, nausea and diarrhea starting this morning. EXAM: CT ABDOMEN AND PELVIS WITH CONTRAST TECHNIQUE: Multidetector CT imaging of the abdomen and pelvis was performed using the standard protocol following bolus administration of intravenous contrast. CONTRAST:  ISOVUE-300 IOPAMIDOL (ISOVUE-300) INJECTION 61% COMPARISON:  None. FINDINGS: Lower chest: No acute abnormality. Hepatobiliary: No focal liver abnormality is seen. No gallstones, gallbladder wall thickening, or biliary dilatation. Pancreas: Unremarkable. No pancreatic ductal dilatation or surrounding inflammatory changes. Spleen: Normal in size without focal abnormality. Adrenals/Urinary Tract: Adrenal  glands appear normal. Small LEFT renal cyst. Kidneys otherwise unremarkable without suspicious mass, stone or hydronephrosis. No perinephric inflammation. No ureteral or bladder calculi identified. Bladder is unremarkable, partially decompressed. Stomach/Bowel: No dilated large or small bowel loops. Appendix: Location: RIGHT lower quadrant Diameter: 9 mm Appendicolith: Not Seen Mucosal hyper-enhancement: Present Extraluminal gas: Not Seen Periappendiceal collection: Small amount of periappendiceal free fluid. No abscess collection seen. Vascular/Lymphatic: No significant vascular findings are present. No enlarged abdominal or pelvic lymph nodes. Reproductive: Uterus and bilateral adnexa are unremarkable. Other: No abscess collection seen.  No free intraperitoneal air. Musculoskeletal: No acute or significant osseous findings. IMPRESSION: Acute appendicitis. Appendix is distended to approximately 9 mm. There is a small amount of associated periappendiceal free fluid. No periappendiceal abscess collection seen. No free intraperitoneal air seen. Electronically Signed   By: Bary Richard M.D.   On: 03/17/2018 15:34      ____________________________________________   PROCEDURES  Procedure(s) performed: None Procedures Critical Care performed: yes  CRITICAL CARE Performed by: Nita Sickle  ?  Total critical care time: 30 min  Critical care time was exclusive of separately billable procedures and treating other  patients.  Critical care was necessary to treat or prevent imminent or life-threatening deterioration.  Critical care was time spent personally by me on the following activities: development of treatment plan with patient and/or surrogate as well as nursing, discussions with consultants, evaluation of patient's response to treatment, examination of patient, obtaining history from patient or surrogate, ordering and performing treatments and interventions, ordering and review of laboratory  studies, ordering and review of radiographic studies, pulse oximetry and re-evaluation of patient's condition.  ____________________________________________   INITIAL IMPRESSION / ASSESSMENT AND PLAN / ED COURSE  32 y.o. female no significant past medical history who presents for evaluation of abdominal pain, chills, vomiting, and 2 episodes of watery diarrhea.  Patient looks uncomfortable due to the pain, curled up in bed, she has diffuse tenderness worse in the periumbilical and right lower quadrant regions with no rebound or guarding, vitals are within normal limits.  Labs show leukocytosis with white count of 18.8. HCG and UA pending. Ddx appendicitis vs colitis vs diverticulitis vs UTI vs pyelonephritis vs STD vs PID vs ectopic vs GB pathology. CT pending. Will give toradol and zofran    _________________________ 3:45 PM on 03/17/2018 -----------------------------------------  CT consistent with acute uncomplicated appendicitis.  Will start patient on Zosyn. Discussed with Dr. Excell Seltzerooper for admission.    As part of my medical decision making, I reviewed the following data within the electronic MEDICAL RECORD NUMBER Nursing notes reviewed and incorporated, Labs reviewed , Old chart reviewed, Radiograph reviewed , A consult was requested and obtained from this/these consultant(s) Surgery, Notes from prior ED visits and [place] Controlled Substance Database    Pertinent labs & imaging results that were available during my care of the patient were reviewed by me and considered in my medical decision making (see chart for details).    ____________________________________________   FINAL CLINICAL IMPRESSION(S) / ED DIAGNOSES  Final diagnoses:  Acute appendicitis, uncomplicated      NEW MEDICATIONS STARTED DURING THIS VISIT:  ED Discharge Orders    None       Note:  This document was prepared using Dragon voice recognition software and may include unintentional dictation errors.      Don PerkingVeronese, [place], MD 03/17/18 [phone]

## 2018-03-17 NOTE — H&P (Signed)
Claire Curtis is an 32 y.o. female.    Chief Complaint: Right lower quadrant pain  HPI: This a patient with the acute onset of right lower quadrant pain this morning.  She states that she was perfectly well yesterday and last night.  She is never had an episode like this before but states that she was at an urgent care a week ago with fever and some nausea but no abdominal pain.  She is had no fevers during this episode but has vomited several times today.  She is not hungry.  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 Herpes    last outbreak, last year  . Vaginal Pap smear, abnormal   . Warts, genital     Past Surgical History:  Procedure Laterality Date  . CESAREAN SECTION      No family history on file. Social History:  reports that she has never smoked. She has never used smokeless tobacco. She reports that she drinks alcohol. She reports that she does not use drugs.  Allergies: No Known Allergies   (Not in a hospital admission)   Review of Systems  Constitutional: Negative.   HENT: Negative.   Eyes: Negative.   Respiratory: Negative.   Cardiovascular: Negative.   Gastrointestinal: Positive for abdominal pain, nausea and vomiting. Negative for constipation, diarrhea and heartburn.  Genitourinary: Negative.   Musculoskeletal: Negative.   Skin: Negative.   Neurological: Negative.   Endo/Heme/Allergies: Negative.   Psychiatric/Behavioral: Negative.      Physical Exam:  BP 116/74   Pulse 82   Temp 98.5 F (36.9 C) (Oral)   Resp 18   Ht 5' 4"  (1.626 m)   Wt 63 kg   LMP 03/10/2018 Comment: neg preg per RN  SpO2 100%   BMI 23.86 kg/m   Physical Exam  Constitutional: She appears well-developed and well-nourished.  Non-toxic appearance. She does not appear ill. No distress.   Moves about well in the bed without difficulty  HENT:  Head: Atraumatic.  Eyes: Pupils are equal, round, and reactive to light. EOM are normal.  Cardiovascular: Normal rate and regular rhythm.  Pulmonary/Chest: Effort normal and breath sounds normal.  Abdominal: Soft. Normal appearance. There is tenderness in the right lower quadrant. There is guarding. There is no rebound. A hernia is present.  Tenderness in the right lower quadrant with a positive Rovsing sign some guarding but no percussion tenderness or rebound.  Small umbilical hernia which is reducible and nontender.  Skin: Skin is warm and dry.  Vitals reviewed.       Results for orders placed or performed during the hospital encounter of 03/17/18 (from the past 48 hour(s))  Lipase, blood     Status: None   Collection Time: 03/17/18  2:03 PM  Result Value Ref Range   Lipase 30 11 - 51 U/L    Comment: Performed at [facility], [address] [place], [place] [address]  Comprehensive metabolic panel     Status: Abnormal   Collection Time: 03/17/18  2:03 PM  Result Value Ref Range   Sodium 135 135 - 145 mmol/L   Potassium 4.0 3.5 - 5.1 mmol/L    Comment: HEMOLYSIS AT THIS LEVEL MAY AFFECT RESULT   Chloride 106 98 - 111 mmol/L   CO2 23 22 - 32  mmol/L   Glucose, Bld 118 (H) 70 - 99 mg/dL   BUN 11 6 - 20 mg/dL   Creatinine, Ser 0.61 0.44 - 1.00 mg/dL   Calcium 9.1 8.9 - 10.3 mg/dL   Total Protein 7.6 6.5 - 8.1 g/dL   Albumin 4.6 3.5 - 5.0 g/dL   AST 23 15 - 41 U/L    Comment: HEMOLYSIS AT THIS LEVEL MAY AFFECT RESULT   ALT 16 0 - 44 U/L   Alkaline Phosphatase 44 38 - 126 U/L   Total Bilirubin 0.8 0.3 - 1.2 mg/dL    Comment: HEMOLYSIS AT THIS LEVEL MAY AFFECT RESULT   GFR calc non Af Amer >60 >60 mL/min   GFR calc Af Amer >60 >60 mL/min    Comment: (NOTE) The eGFR has been calculated using the CKD EPI equation. This calculation has not been validated in all clinical situations. eGFR's persistently <60  mL/min signify possible Chronic Kidney Disease.    Anion gap 6 5 - 15    Comment: Performed at [facility], [address] [place], [place] [address]  CBC     Status: Abnormal   Collection Time: 03/17/18  2:03 PM  Result Value Ref Range   WBC 18.8 (H) 3.6 - 11.0 K/uL   RBC 4.58 3.80 - 5.20 MIL/uL   Hemoglobin 13.0 12.0 - 16.0 g/dL   HCT 39.3 35.0 - 47.0 %   MCV 85.9 80.0 - 100.0 fL   MCH 28.4 26.0 - 34.0 pg   MCHC 33.1 32.0 - 36.0 g/dL   RDW 12.5 11.5 - 14.5 %   Platelets 335 150 - 440 K/uL    Comment: Performed at [facility], [address] [place], [place] [address]  Urinalysis, Complete w Microscopic     Status: Abnormal   Collection Time: 03/17/18  2:03 PM  Result Value Ref Range   Color, Urine YELLOW (A) YELLOW   APPearance CLEAR (A) CLEAR   Specific Gravity, Urine 1.018 1.005 - 1.030   pH 7.0 5.0 - 8.0   Glucose, UA NEGATIVE NEGATIVE mg/dL   Hgb urine dipstick SMALL (A) NEGATIVE   Bilirubin Urine NEGATIVE NEGATIVE   Ketones, ur 20 (A) NEGATIVE mg/dL   Protein, ur NEGATIVE NEGATIVE mg/dL   Nitrite NEGATIVE NEGATIVE   Leukocytes, UA NEGATIVE NEGATIVE   RBC / HPF 0-5 0 - 5 RBC/hpf   WBC, UA 0-5 0 - 5 WBC/hpf   Bacteria, UA NONE SEEN NONE SEEN   Squamous Epithelial / LPF 0-5 0 - 5   Mucus PRESENT     Comment: Performed at [facility] [address], [address] [place], [place] [address]  hCG, quantitative, pregnancy     Status: None   Collection Time: 03/17/18  2:32 PM  Result Value Ref Range   hCG, Beta Chain, Quant, S <1 <5 mIU/mL    Comment:          GEST. AGE      CONC.  (mIU/mL)   <=1 WEEK        5 - 50     2 WEEKS       50 - 500     3 WEEKS       100 - 10,000     4 WEEKS     1,000 - 30,000     5 WEEKS     3,500 - 115,000   6-8 WEEKS     12,000 - 270,000    12 WEEKS  15,000 - 220,000        FEMALE AND NON-PREGNANT FEMALE:     LESS THAN 5 mIU/mL Performed at [facility], [address] [place], [place] [address]    Pregnancy, urine POC     Status: None   Collection Time: 03/17/18  2:49 PM  Result Value Ref Range   Preg Test, Ur NEGATIVE NEGATIVE    Comment:        THE SENSITIVITY OF THIS METHODOLOGY IS >24 mIU/mL    Ct Abdomen Pelvis W Contrast  Result Date: 03/17/2018 CLINICAL DATA:  Severe abdominal pain, mid to lower abdomen, with vomiting, nausea and diarrhea starting this morning. EXAM: CT ABDOMEN AND PELVIS WITH CONTRAST TECHNIQUE: Multidetector CT imaging of the abdomen and pelvis was performed using the standard protocol following bolus administration of intravenous contrast. CONTRAST:  171m ISOVUE-300 IOPAMIDOL (ISOVUE-300) INJECTION 61% COMPARISON:  None. FINDINGS: Lower chest: No acute abnormality. Hepatobiliary: No focal liver abnormality is seen. No gallstones, gallbladder wall thickening, or biliary dilatation. Pancreas: Unremarkable. No pancreatic ductal dilatation or surrounding inflammatory changes. Spleen: Normal in size without focal abnormality. Adrenals/Urinary Tract: Adrenal glands appear normal. Small LEFT renal cyst. Kidneys otherwise unremarkable without suspicious mass, stone or hydronephrosis. No perinephric inflammation. No ureteral or bladder calculi identified. Bladder is unremarkable, partially decompressed. Stomach/Bowel: No dilated large or small bowel loops. Appendix: Location: RIGHT lower quadrant Diameter: 9 mm Appendicolith: Not Seen Mucosal hyper-enhancement: Present Extraluminal gas: Not Seen Periappendiceal collection: Small amount of periappendiceal free fluid. No abscess collection seen. Vascular/Lymphatic: No significant vascular findings are present. No enlarged abdominal or pelvic lymph nodes. Reproductive: Uterus and bilateral adnexa are unremarkable. Other: No abscess collection seen.  No free intraperitoneal air. Musculoskeletal: No acute or significant osseous findings. IMPRESSION: Acute appendicitis. Appendix is distended to approximately 9 mm. There is a small  amount of associated periappendiceal free fluid. No periappendiceal abscess collection seen. No free intraperitoneal air seen. Electronically Signed   By: SFranki CabotM.D.   On: 03/17/2018 15:34     Assessment/Plan  CT scans been reviewed.  Labs been reviewed.  Leukocytosis is present.  This patient with right lower quadrant pain and tenderness with a history and physical suggestive of acute appendicitis.  She also has a small umbilical hernia.  I recommended laparoscopic appendectomy but I would not recommend repair of the small asymptomatic umbilical hernia at this time because of the risk of infection.  I would recommend laparoscopic appendectomy however the rationale for this was discussed the options of observation reviewed the options of antibiotic therapy was discussed and the risks of a ruptured appendix were reviewed as well as the risk of abscess and infection and bowel injury and conversion to an open procedure she and her family understood and agreed with this plan for laparoscopic appendectomy this evening.  RFlorene Glen MD, FACS

## 2018-03-17 NOTE — ED Notes (Signed)
UA POC PREG NEG

## 2018-03-17 NOTE — ED Notes (Signed)
Pt ambulatory to bathroom

## 2018-03-17 NOTE — ED Notes (Signed)
Pt ambulatory to bathroom at his time.

## 2018-03-18 ENCOUNTER — Other Ambulatory Visit: Payer: Self-pay

## 2018-03-18 ENCOUNTER — Encounter: Payer: Self-pay | Admitting: *Deleted

## 2018-03-18 MED ORDER — HYDROCODONE-ACETAMINOPHEN 5-325 MG PO TABS: 1.0000 | ORAL_TABLET | Freq: Four times a day (QID) | ORAL | 0 refills | Status: DC | PRN

## 2018-03-18 NOTE — Progress Notes (Signed)
03/18/2018 10:52 AM  Pt complained of numbness in right lips and mouth, as well as severe pain despite administration of PRN meds previous to now.  Vital signs stable and neuro checks WDL.  Spoke with Dr. Excell Seltzerooper who suggested this numbness is related to intubation during surgery and is expected to subside without further intervention.  Will explain to pt and family.  Dr. Excell Seltzerooper suggested giving additional dose of Norco for pain at this time as well.  Will continue to monitor and assess.  Bradly Chrisougherty, Jessen Siegman E, RN

## 2018-03-18 NOTE — Discharge Instructions (Signed)
Laparoscopic Appendectomy, Adult, Care After These instructions give you information about caring for yourself after your procedure. Your doctor may also give you more specific instructions. Call your doctor if you have any problems or questions after your procedure. Follow these instructions at home: Medicines  Take over-the-counter and prescription medicines only as told by your doctor.  Do not drive for 24 hours if you received a sedative.  Do not drive or use heavy machinery while taking prescription pain medicine.  If you were prescribed an antibiotic medicine, take it as told by your doctor. Do not stop taking it even if you start to feel better. Activity  Do not lift anything that is heavier than 10 pounds (4.5 kg) for 3 weeks or as told by your doctor.  Do not play contact sports for 3 weeks or as told by your doctor.  Slowly return to your normal activities. Bathing  Keep your cuts from surgery (incisions) clean and dry. ? Gently wash the cuts with soap and water. ? Rinse the cuts with water until the soap is gone. ? Pat the cuts dry with a clean towel. Do not rub the cuts.  You may take showers after 48 hours.  Do not take baths, swim, or use a hot tub for 2 weeks or as told by your doctor. Cut Care  Follow instructions from your doctor about how to take care of your cuts. Make sure you: ? Wash your hands with soap and water before you change your bandage (dressing). If you do not have soap and water, use hand sanitizer. ? Change your bandage as told by your doctor. ? Leave stitches (sutures), skin glue, or skin tape (adhesive) strips in place. They may need to stay in place for 2 weeks or longer. If tape strips get loose and curl up, you may trim the loose edges. Do not remove tape strips completely unless your doctor says it is okay.  Check your cuts every day for signs of infection. Check for: ? More redness, swelling, or pain. ? More fluid or  blood. ? Warmth. ? Pus or a bad smell. Other Instructions  If you were sent home with a drain, follow instructions from your doctor about how to use it and care for it.  Take deep breaths. This helps to keep your lungs from getting swollen (inflamed).  To help with constipation: ? Drink plenty of fluids. ? Eat plenty of fruits and vegetables.  Keep all follow-up visits as told by your doctor. This is important. Contact a doctor if:  You have more redness, swelling, or pain around a cut from surgery.  You have more fluid or blood coming from a cut.  Your cut feels warm to the touch.  You have pus or a bad smell coming from a cut or a bandage.  The edges of a cut break open after the stitches have been taken out.  You have pain in your shoulders that gets worse.  You feel dizzy or you pass out (faint).  You have shortness of breath.  You keep feeling sick to your stomach (nauseous).  You keep throwing up (vomiting).  You get diarrhea or you cannot control your poop.  You lose your appetite.  You have swelling or pain in your legs. Get help right away if:  You have a fever.  You get a rash.  You have trouble breathing.  You have sharp pains in your chest. This information is not intended to replace advice  given to you by your health care provider. Make sure you discuss any questions you have with your health care provider. Document Released: 04/17/2009 Document Revised: 11/27/2015 Document Reviewed: 12/09/2014 Elsevier Interactive Patient Education  2018 [employer] Remove dressing in 24 hours. May shower in 24 hours. Leave paper strips in place. Resume all home medications. No heavy lifting or strenuous activity for 4 weeks, or otherwise determined at office follow-up. Follow-up with Dr. Excell Seltzerooper in 10 days.

## 2018-03-18 NOTE — Progress Notes (Signed)
1 Day Post-Op  Subjective: Patient feels well this morning following laparoscopic appendectomy.  Is to advance diet  Objective: Vital signs in last 24 hours: Temp:  [97.2 F (36.2 C)-98.5 F (36.9 C)] 98.3 F (36.8 C) (09/14 0515) Pulse Rate:  [71-111] 71 (09/14 0515) Resp:  [16-20] 16 (09/14 0515) BP: (99-118)/(56-74) 102/58 (09/14 0515) SpO2:  [100 %] 100 % (09/14 0515) Weight:  [63 kg] 63 kg (09/13 1400) Last BM Date: 03/17/18  Intake/Output from previous day: 09/13 0701 - 09/14 0700 In: 2136.3 [P.O.:360; I.V.:1776.3] Out: 855 [Urine:850; Blood:5] Intake/Output this shift: No intake/output data recorded.  Physical exam:  Minimal tenderness around left lower quadrant wounds are dressed and clean.  Lab Results: CBC  Recent Labs    03/17/18 1403  WBC 18.8*  HGB 13.0  HCT 39.3  PLT 335   BMET Recent Labs    03/17/18 1403  NA 135  K 4.0  CL 106  CO2 23  GLUCOSE 118*  BUN 11  CREATININE 0.61  CALCIUM 9.1   PT/INR No results for input(s): LABPROT, INR in the last 72 hours. ABG No results for input(s): PHART, HCO3 in the last 72 hours.  Invalid input(s): PCO2, PO2  Studies/Results: Ct Abdomen Pelvis W Contrast  Result Date: 03/17/2018 CLINICAL DATA:  Severe abdominal pain, mid to lower abdomen, with vomiting, nausea and diarrhea starting this morning. EXAM: CT ABDOMEN AND PELVIS WITH CONTRAST TECHNIQUE: Multidetector CT imaging of the abdomen and pelvis was performed using the standard protocol following bolus administration of intravenous contrast. CONTRAST:  100mL ISOVUE-300 IOPAMIDOL (ISOVUE-300) INJECTION 61% COMPARISON:  None. FINDINGS: Lower chest: No acute abnormality. Hepatobiliary: No focal liver abnormality is seen. No gallstones, gallbladder wall thickening, or biliary dilatation. Pancreas: Unremarkable. No pancreatic ductal dilatation or surrounding inflammatory changes. Spleen: Normal in size without focal abnormality. Adrenals/Urinary Tract:  Adrenal glands appear normal. Small LEFT renal cyst. Kidneys otherwise unremarkable without suspicious mass, stone or hydronephrosis. No perinephric inflammation. No ureteral or bladder calculi identified. Bladder is unremarkable, partially decompressed. Stomach/Bowel: No dilated large or small bowel loops. Appendix: Location: RIGHT lower quadrant Diameter: 9 mm Appendicolith: Not Seen Mucosal hyper-enhancement: Present Extraluminal gas: Not Seen Periappendiceal collection: Small amount of periappendiceal free fluid. No abscess collection seen. Vascular/Lymphatic: No significant vascular findings are present. No enlarged abdominal or pelvic lymph nodes. Reproductive: Uterus and bilateral adnexa are unremarkable. Other: No abscess collection seen.  No free intraperitoneal air. Musculoskeletal: No acute or significant osseous findings. IMPRESSION: Acute appendicitis. Appendix is distended to approximately 9 mm. There is a small amount of associated periappendiceal free fluid. No periappendiceal abscess collection seen. No free intraperitoneal air seen. Electronically Signed   By: Bary RichardStan  Maynard M.D.   On: 03/17/2018 15:34    Anti-infectives: Anti-infectives (From admission, onward)   Start     Dose/Rate Route Frequency Ordered Stop   03/17/18 1545  piperacillin-tazobactam (ZOSYN) IVPB 3.375 g     3.375 g 100 mL/hr over 30 Minutes Intravenous  Once 03/17/18 1542 03/17/18 1747      Assessment/Plan: s/p Procedure(s): APPENDECTOMY LAPAROSCOPIC   Patient doing well following laparoscopic appendectomy.  Will advance diet.  Home later this morning.  Follow-up in the office 10 days and showering instructions are given.  Wound care instructions are given as well oral analgesics for pain.  Lattie Hawichard E Angeleigh Chiasson, MD, FACS  03/18/2018

## 2018-03-18 NOTE — Progress Notes (Signed)
03/18/2018 12:40 PM  Claire Curtis to be D/C'd Home per MD order.  Discussed prescriptions and follow up appointments with the patient. Prescriptions given to patient, medication list explained in detail. Pt verbalized understanding.  Allergies as of 03/18/2018   No Known Allergies     Medication List    TAKE these medications   HYDROcodone-acetaminophen 5-325 MG tablet Commonly known as:  NORCO/VICODIN Take 1-2 tablets by mouth every 6 (six) hours as needed for moderate pain.   ondansetron 4 MG tablet Commonly known as:  ZOFRAN Take 1 tablet (4 mg total) by mouth every 6 (six) hours.       Vitals:   03/18/18 0515 03/18/18 1025  BP: (!) 102/58 111/71  Pulse: 71 62  Resp: 16 15  Temp: 98.3 F (36.8 C) 97.9 F (36.6 C)  SpO2: 100% 100%    Skin clean, dry and intact without evidence of skin break down, no evidence of skin tears noted. IV catheter discontinued intact. Site without signs and symptoms of complications. Dressing and pressure applied. Pt denies pain at this time. No complaints noted.  An After Visit Summary was printed and given to the patient. Patient escorted via WC, and D/C home via private auto.  Bradly Chrisougherty, Jimeka Balan E

## 2018-03-20 ENCOUNTER — Telehealth: Payer: Self-pay | Admitting: Surgery

## 2018-03-20 ENCOUNTER — Encounter: Payer: Self-pay | Admitting: Surgery

## 2018-03-20 NOTE — Telephone Encounter (Signed)
Called patient back to let her know that I had faxed her work letter to her employer.  I also asked her about her numbness on her lips and right cheek. She stated that she started to have the numbness since after surgery and that she had mentioned this to the nurse that was attending her and told her that it could be due to the anesthesia. However, it has been 3 days since surgery and she is still having the numbness. I told her that I would have to ask Dr. Everlene FarrierPabon (clinic doctor) and see if he had any recommendations. Patient understood.

## 2018-03-20 NOTE — Anesthesia Postprocedure Evaluation (Signed)
Anesthesia Post Note  Patient: Trecia RogersDominique Askren  Procedure(s) Performed: APPENDECTOMY LAPAROSCOPIC (N/A Abdomen)  Patient location during evaluation: PACU Anesthesia Type: General Level of consciousness: awake and alert Pain management: pain level controlled Vital Signs Assessment: post-procedure vital signs reviewed and stable Respiratory status: spontaneous breathing, nonlabored ventilation and respiratory function stable Cardiovascular status: blood pressure returned to baseline and stable Postop Assessment: no apparent nausea or vomiting Anesthetic complications: no     Last Vitals:  Vitals:   03/18/18 0515 03/18/18 1025  BP: (!) 102/58 111/71  Pulse: 71 62  Resp: 16 15  Temp: 36.8 C 36.6 C  SpO2: 100% 100%    Last Pain:  Vitals:   03/18/18 1203  TempSrc:   PainSc: Asleep                 Jovita GammaKathryn L Fitzgerald

## 2018-03-20 NOTE — Telephone Encounter (Signed)
Patient has called requesting a work note. Patient had a laparoscopic appendectomy on 03/17/18-Dr Excell Seltzerooper. She has a post op appointment on 03/27/18 with Dr Excell Seltzerooper. I informed her that we will write her a letter with the dates 9/13-9/23 to be out of work and then decided return date will be discussed at the post op visit. Please make the letter Attn: Ms. Coralee RudDudley fax: [phone]. Please call patient once this has been faxed.   Patient would also like to talk with the nurse about a numbing sensation around her mouth/cheek since surgery. She stated that she did [facility] this to [facility],RN prior to discharge. Please call her at [phone].

## 2018-03-22 ENCOUNTER — Telehealth: Payer: Self-pay

## 2018-03-22 LAB — SURGICAL PATHOLOGY

## 2018-03-22 NOTE — Telephone Encounter (Signed)
Patient's FMLA form was filled and faxed by Velna HatchetSheila.

## 2018-03-27 ENCOUNTER — Encounter: Payer: Self-pay | Admitting: Surgery

## 2018-03-27 ENCOUNTER — Ambulatory Visit (INDEPENDENT_AMBULATORY_CARE_PROVIDER_SITE_OTHER): Payer: BC Managed Care – PPO | Admitting: Surgery

## 2018-03-27 VITALS — BP 118/70 | HR 72 | Temp 97.7°F | Ht 65.0 in | Wt 139.0 lb

## 2018-03-27 DIAGNOSIS — K3589 Other acute appendicitis without perforation or gangrene: Secondary | ICD-10-CM

## 2018-03-27 NOTE — Progress Notes (Signed)
Outpatient postop visit  03/27/2018  Trecia RogersDominique Driskill is an 32 y.o. female.    Procedure: Lap scopic appendectomy  CC no problems HPI: This patient status post laparoscopic appendectomy.  Patient feels well but is concerned about her left lower quadrant incision.  He is worried about cosmetics and states that it is "open".  Medications reviewed.    Physical Exam:  BP 118/70   Pulse 72   Temp 97.7 F (36.5 C) (Skin)   Ht 5\' 5"  (1.651 m)   Wt 139 lb (63 kg)   LMP 03/10/2018 Comment: neg preg per RN  BMI 23.13 kg/m     PE: Abdomen soft nontender nondistended nontympanitic.  All wounds are closed and healing well.  There is a sub-millimeter area of non-epithelialized incision at either and of the 13 mm incision.  This would be typical of a laparoscopic appendectomy incision at this time.  Is no erythema no drainage no purulence and nontender.    Assessment/Plan:  Patient is status post laparoscopic appendectomy.  Pathology is reviewed and confirms the diagnosis without sign of malignancy.  Left lower quadrant 13 mm incision is healing fine.  It is exactly as expected.  There is no sign of infection.  I recommended Mederma or vitamin E cream.  She states she is been using cocoa butter on it.  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 puts her at risk.  I recommend that she stay off work for another 2 weeks.  No lifting and sports until that time.  Lattie Hawichard E Adaora Mchaney, MD, FACS

## 2018-03-27 NOTE — Patient Instructions (Signed)
Return to work 04/17/2018.

## 2018-04-20 ENCOUNTER — Telehealth: Payer: Self-pay

## 2018-04-20 ENCOUNTER — Encounter: Payer: Self-pay | Admitting: *Deleted

## 2018-04-20 NOTE — Telephone Encounter (Signed)
Letter was written and faxed. Patient was notified.

## 2018-04-20 NOTE — Telephone Encounter (Signed)
Patient's letter was done and faxed.

## 2018-04-20 NOTE — Progress Notes (Unsigned)
Patient came by the office today stating that she needs an additional form filled out for return to work.   The patient states that our office had given her a return to work letter but it was not sufficient for work and she was sent home.   Patient had seen Dr. Excell Seltzer.  Patient states she needs this completed today and faxed to [phone] Attn: Ms. Coralee Rud.   The patient would like a call to let her know that this has been done at [phone].

## 2019-03-06 ENCOUNTER — Other Ambulatory Visit: Payer: Self-pay

## 2019-03-06 ENCOUNTER — Encounter: Payer: Self-pay | Admitting: Emergency Medicine

## 2019-03-06 ENCOUNTER — Ambulatory Visit
Admission: EM | Admit: 2019-03-06 | Discharge: 2019-03-06 | Disposition: A | Discharge status: Home / Self Care | Payer: BC Managed Care – PPO | Attending: Family Medicine | Admitting: Family Medicine

## 2019-03-06 DIAGNOSIS — R112 Nausea with vomiting, unspecified: Secondary | ICD-10-CM

## 2019-03-06 DIAGNOSIS — J029 Acute pharyngitis, unspecified: Secondary | ICD-10-CM | POA: Diagnosis not present

## 2019-03-06 DIAGNOSIS — R197 Diarrhea, unspecified: Secondary | ICD-10-CM

## 2019-03-06 DIAGNOSIS — R509 Fever, unspecified: Secondary | ICD-10-CM

## 2019-03-06 DIAGNOSIS — J039 Acute tonsillitis, unspecified: Secondary | ICD-10-CM | POA: Diagnosis not present

## 2019-03-06 DIAGNOSIS — Z7189 Other specified counseling: Secondary | ICD-10-CM

## 2019-03-06 LAB — RAPID STREP SCREEN (MED CTR MEBANE ONLY): Streptococcus, Group A Screen (Direct): NEGATIVE

## 2019-03-06 MED ORDER — PENICILLIN G BENZATHINE 1200000 UNIT/2ML IM SUSP
1.2000 10*6.[IU] | Freq: Once | INTRAMUSCULAR | Status: AC
  Administered 2019-03-06: 1.2 10*6.[IU] via INTRAMUSCULAR

## 2019-03-06 NOTE — ED Triage Notes (Signed)
Pt c/o sore throat, fever (101), diarrhea, vomiting, and weakness. Started about 3 days ago. She has noticed white spots on her left tonsil. Pt is pregnant but is unsure of how many weeks. She states that she has only had her confirmation of pregnancy appt. Her last period was July 19 th.

## 2019-03-06 NOTE — Discharge Instructions (Addendum)
It was very nice seeing you today in clinic. Thank you for entrusting me with your care.   Increase fluid intake as much as possible. Water is always best, as sugar and caffeine containing fluids can cause you to become dehydrated. Try to incorporate electrolyte enriched fluids, such as Gatorade or Pedialyte, into your daily fluid intake. May use Tylenol as needed for pain/fever.   You were tested for SARS-CoV-2 (novel coronavirus) today in clinic. This is a send out test to [place]. Result turn around time has been taking on average 1-3 days at this point. Please self quarantine at home, per [facility] guidelines, until negative test results have been received.   Make arrangements to follow up with your regular doctor in 1 week for re-evaluation if not improving. If your symptoms/condition worsens, please seek follow up care either here or in the ER. Please remember, our [facility] providers are "right here with you" when you need [place].   Again, it was my pleasure to take care of you today. Thank you for choosing our clinic. I hope that you start to feel better quickly.   Honor Loh, MSN, APRN, FNP-C, CEN Advanced Practice Provider [facility]

## 2019-03-06 NOTE — ED Provider Notes (Signed)
[place], [place]   Name: Claire Curtis DOB: 1985-09-03 MRN: [id number] CSN: [id number] PCP: Glory Buff, MD  Arrival date and time:  03/06/19 1402  Chief Complaint:  Sore Throat and Fever   NOTE: Prior to seeing the patient today, I have reviewed the triage nursing documentation and vital signs. Clinical staff has updated patient's PMH/PSHx, current medication list, and drug allergies/intolerances to ensure comprehensive history available to assist in medical decision making.   History:   HPI: Claire Curtis is a 33 y.o. female who presents today with complaints of sore throat and fever that began 3 days ago. Patient notes that her temperatures have been as high 101.3. Patient advising that her throat really only hurts on the LEFT side, where she is describing tonsillar swelling and exudate. She has experienced some concurrent nausea, vomiting, and diarrhea. She has only had a single episode of emesis and diarrhea today. Patient denies difficulty swallowing or shortness of breath. Patient is eating and drinking well. Despite her symptoms, patient has not taken any over the counter interventions to help improve/relieve her reported symptoms at home. Patient denies close contact with anyone know to be ill. She is employed in a prison setting. She has never been tested for SARS-CoV-2 (novel coronavirus).   Additionally, patient recently found out she is pregnant. Patient denies any vaginal bleeding or abdominal pain. Patient is followed by Alesia Richards, MD) for prenatal care; last visit was on 02/26/2019.   Past Medical History:  Diagnosis Date  . Herpes    last outbreak, last year  . Vaginal Pap smear, abnormal   . Warts, genital     Past Surgical History:  Procedure Laterality Date  . CESAREAN SECTION    . LAPAROSCOPIC APPENDECTOMY N/A 03/17/2018   Procedure: APPENDECTOMY LAPAROSCOPIC;  Surgeon: Florene Glen, MD;  Location: [facility] ORS;  Service: General;  Laterality: N/A;    Family History  Problem Relation Age of Onset  . Healthy Mother   . Healthy Father     Social History   Tobacco Use  . Smoking status: Never Smoker  . Smokeless tobacco: Never Used  Substance Use Topics  . Alcohol use: Yes    Comment: occasionally  . Drug use: No    Patient Active Problem List   Diagnosis Date Noted  . Acute appendicitis 03/17/2018  . Acute appendicitis, uncomplicated   . Supervision of normal subsequent pregnancy 10/17/2013  . History of cesarean section 10/17/2013  . Nausea and vomiting in pregnancy prior to [redacted] weeks gestation 10/17/2013    Home Medications:    Current Meds  Medication Sig  . Prenatal Vit-Fe Fumarate-FA (MULTIVITAMIN-PRENATAL) 27-0.8 MG TABS tablet Take 1 tablet by mouth daily at 12 noon.  . valACYclovir (VALTREX) 500 MG tablet Take 500 mg by mouth as needed.    Allergies:   Patient has no known allergies.  Review of Systems (ROS): Review of Systems  Constitutional: Positive for fever (Tmax 101.3). Negative for chills.  HENT: Positive for sore throat. Negative for congestion, drooling, ear pain, mouth sores, postnasal drip, rhinorrhea, sinus pressure and sinus pain.   Respiratory: Negative for cough and shortness of breath.   Cardiovascular: Negative for chest pain and palpitations.  Gastrointestinal: Positive for diarrhea, nausea and vomiting. Negative for abdominal pain.  Musculoskeletal: Negative for back pain and myalgias.  Skin: Negative for color change, pallor and rash.  Neurological: Negative for dizziness, syncope, weakness and headaches.  All other systems reviewed and are negative.    Vital  Signs: Today's Vitals   03/06/19 1422 03/06/19 1425 03/06/19 1429  BP:   114/71  Pulse:   90  Resp:   18  Temp:   99 F (37.2 C)  TempSrc:   Oral  SpO2:   100%  Weight:  161 lb (73 kg)   Height:  5\' 4"  (1.626 m)   PainSc: 7       Physical Exam: Physical Exam  Constitutional: She is oriented to person, place, and  time and well-developed, well-nourished, and in no distress.  HENT:  Head: Normocephalic and atraumatic.  Right Ear: Hearing, tympanic membrane, external ear and ear canal normal.  Left Ear: Hearing, tympanic membrane and external ear normal.  Nose: Nose normal.  Mouth/Throat: Mucous membranes are normal. Posterior oropharyngeal edema and posterior oropharyngeal erythema present.  LEFT tonsil is grade III with (+) white exudative patches noted  Eyes: Pupils are equal, round, and reactive to light. EOM are normal.  Neck: Normal range of motion. Neck supple. No tracheal deviation present.  Cardiovascular: Normal rate, regular rhythm, normal heart sounds and intact distal pulses. Exam reveals no gallop and no friction rub.  No murmur heard. Pulmonary/Chest: Effort normal and breath sounds normal. No respiratory distress. She has no wheezes. She has no rales.  Abdominal: Soft. Bowel sounds are normal. She exhibits no distension. There is no abdominal tenderness.  Lymphadenopathy:       Head (left side): Submandibular and tonsillar adenopathy present.  Neurological: She is alert and oriented to person, place, and time. Gait normal.  Skin: Skin is warm and dry. No rash noted.  Psychiatric: Mood, memory, affect and judgment normal.  Nursing note and vitals reviewed.   Urgent Care Treatments / Results:   LABS: PLEASE NOTE: all labs that were ordered this encounter are listed, however only abnormal results are displayed. Labs Reviewed  RAPID STREP SCREEN ([facility] ONLY)  NOVEL CORONAVIRUS, NAA (HOSP ORDER, SEND-OUT TO REF LAB; TAT 18-24 HRS)  CULTURE, GROUP A STREP [facility])    EKG: -None  RADIOLOGY: No results found.  PROCEDURES: Procedures  MEDICATIONS RECEIVED THIS VISIT: Medications  penicillin g benzathine (BICILLIN LA) 1200000 UNIT/2ML injection 1.2 Million Units (1.2 Million Units Intramuscular Given 03/06/19 1506)    PERTINENT CLINICAL COURSE NOTES/UPDATES:   Initial  Impression / Assessment and Plan / Urgent Care Course:  Pertinent labs & imaging results that were available during my care of the patient were personally reviewed by me and considered in my medical decision making (see lab/imaging section of note for values and interpretations).  Trecia RogersDominique Miranda is a 33 y.o. female who [facility] with complaints of Sore Throat and Fever   Patient overall well appearing and in no acute distress today in clinic. Presenting symptoms (see HPI) and exam as documented above. She presents with symptoms associated with SARS-CoV-2 (novel coronavirus). She is employed in a prison setting. Discussed typical symptom constellation. Reviewed potential for infection and need for testing. Patient amenable. Patient collected SARS-CoV-2 swab via facility approved self collection process today under the supervision of certified clinical staff. Discussed variable turn around times associated with testing, as swabs are being processed at [facility], and have been taking as long as 7 days. She was advised to self quarantine, per [facility] guidelines, until negative results received.   Rapid streptococcal throat swab (-); reflex culture sent. Given the appearance of her LEFT tonsil, suspect false negative result. Discussed plans for treatment. Patient refusing oral treatment citing that  she doesn't know if she can tolerate the 10 day course of antibiotics. Patient requesting Bicillin-LA injection. Discussed with attending MD on duty Adriana Simas, DO) who advised me to proceed with the requested Bicillin-LA 1.2 million unit dose as requested. Dicussed supportive care measures at home during acute phase of illness. Patient to rest as much as possible. She was encouraged to ensure adequate hydration (water and ORS) to prevent dehydration and electrolyte derangements. Patient may use APAP on an as needed basis for pain/fever.   Current clinical condition warrants patient being  out of work in order to recover from her current injury/illness. She was provided with the appropriate documentation to provide to her place of employment that will allow for her to RTW on 03/09/2019 with no restrictions. RTW is contingent on her SARS-CoV-2 test results being reviewed as negative.    Discussed follow up with primary care physician in 1 week for re-evaluation. I have reviewed the follow up and strict return precautions for any new or worsening symptoms. Patient is aware of symptoms that would be deemed urgent/emergent, and would thus require further evaluation either here or in the emergency department. At the time of discharge, she verbalized understanding and consent with the discharge plan as it was reviewed with her. All questions were fielded by provider and/or clinic staff prior to patient discharge.    Final Clinical Impressions / Urgent Care Diagnoses:   Final diagnoses:  Exudative tonsillitis  Advice Given About Covid-19 Virus Infection    New Prescriptions:  [place] Controlled Substance Registry consulted? Not Applicable  Meds ordered this encounter  Medications  . penicillin g benzathine (BICILLIN LA) 1200000 UNIT/2ML injection 1.2 Million Units    Order Specific Question:   Antibiotic Indication:    Answer:   Other Indication (list below)    Order Specific Question:   Other Indication:    Answer:   Exudative tonsillitis    Recommended Follow up Care:  Patient encouraged to follow up with the following provider within the specified time frame, or sooner as dictated by the severity of her symptoms. As always, she was instructed that for any urgent/emergent care needs, she should seek care either here or in the emergency department for more immediate evaluation.  Follow-up Information    Alm Bustard, MD In 1 week.   Specialty: Obstetrics and Gynecology Why: General reassessment of symptoms if not improving Contact information: [address] [place]  [address] [phone]         NOTE: This note was prepared using Dragon dictation software along with smaller phrase technology. Despite my best ability to proofread, there is the potential that transcriptional errors may still occur from this process, and are completely unintentional.    Verlee Monte, NP 03/06/19 1527

## 2019-03-07 LAB — NOVEL CORONAVIRUS, NAA (HOSP ORDER, SEND-OUT TO REF LAB; TAT 18-24 HRS): SARS-CoV-2, NAA: NOT DETECTED

## 2019-03-08 ENCOUNTER — Telehealth (HOSPITAL_COMMUNITY): Payer: Self-pay | Admitting: Emergency Medicine

## 2019-03-08 LAB — CULTURE, GROUP A STREP (THRC)

## 2019-03-08 MED ORDER — ONDANSETRON 4 MG PO TBDP: 4.0000 mg | ORAL_TABLET | Freq: Three times a day (TID) | ORAL | 0 refills | Status: DC | PRN

## 2019-03-08 NOTE — Telephone Encounter (Signed)
Pt c/o nausea, pt states she is [redacted] weeks pregnant. Okay to send zofran per Loura Halt, NP

## 2019-03-25 ENCOUNTER — Encounter: Payer: Self-pay | Admitting: Emergency Medicine

## 2019-03-25 ENCOUNTER — Emergency Department
Admission: EM | Admit: 2019-03-25 | Discharge: 2019-03-25 | Disposition: A | Discharge status: Home / Self Care | Payer: BC Managed Care – PPO | Attending: Emergency Medicine | Admitting: Emergency Medicine

## 2019-03-25 ENCOUNTER — Other Ambulatory Visit: Payer: Self-pay

## 2019-03-25 DIAGNOSIS — Z79899 Other long term (current) drug therapy: Secondary | ICD-10-CM | POA: Diagnosis not present

## 2019-03-25 DIAGNOSIS — O210 Mild hyperemesis gravidarum: Secondary | ICD-10-CM | POA: Diagnosis not present

## 2019-03-25 DIAGNOSIS — Z3A09 9 weeks gestation of pregnancy: Secondary | ICD-10-CM | POA: Diagnosis not present

## 2019-03-25 DIAGNOSIS — O219 Vomiting of pregnancy, unspecified: Secondary | ICD-10-CM | POA: Diagnosis present

## 2019-03-25 LAB — COMPREHENSIVE METABOLIC PANEL
ALT: 21 U/L (ref 0–44)
AST: 19 U/L (ref 15–41)
Albumin: 4.1 g/dL (ref 3.5–5.0)
Alkaline Phosphatase: 40 U/L (ref 38–126)
Anion gap: 8 (ref 5–15)
BUN: 10 mg/dL (ref 6–20)
CO2: 23 mmol/L (ref 22–32)
Calcium: 9.5 mg/dL (ref 8.9–10.3)
Chloride: 103 mmol/L (ref 98–111)
Creatinine, Ser: 0.61 mg/dL (ref 0.44–1.00)
GFR calc Af Amer: >60 mL/min (ref >60)
GFR calc non Af Amer: >60 mL/min (ref >60)
Glucose, Bld: 96 mg/dL (ref 70–99)
Potassium: 4 mmol/L (ref 3.5–5.1)
Sodium: 134 mmol/L — ABNORMAL LOW (ref 135–145)
Total Bilirubin: 0.6 mg/dL (ref 0.3–1.2)
Total Protein: 7.6 g/dL (ref 6.5–8.1)

## 2019-03-25 LAB — CBC
HCT: 37.5 % (ref 36.0–46.0)
Hemoglobin: 13 g/dL (ref 12.0–15.0)
MCH: 28.1 pg (ref 26.0–34.0)
MCHC: 34.7 g/dL (ref 30.0–36.0)
MCV: 81.2 fL (ref 80.0–100.0)
Platelets: 331 10*3/uL (ref 150–400)
RBC: 4.62 MIL/uL (ref 3.87–5.11)
RDW: 11.6 % (ref 11.5–15.5)
WBC: 8.3 10*3/uL (ref 4.0–10.5)
nRBC: 0 % (ref 0.0–0.2)

## 2019-03-25 LAB — URINALYSIS, COMPLETE (UACMP) WITH MICROSCOPIC
Bacteria, UA: NONE SEEN
Bilirubin Urine: NEGATIVE
Glucose, UA: NEGATIVE mg/dL
Hgb urine dipstick: NEGATIVE
Ketones, ur: 80 mg/dL — AB
Leukocytes,Ua: NEGATIVE
Nitrite: NEGATIVE
Protein, ur: 30 mg/dL — AB
Specific Gravity, Urine: 1.033 — ABNORMAL HIGH (ref 1.005–1.030)
pH: 6 (ref 5.0–8.0)

## 2019-03-25 LAB — LIPASE, BLOOD: Lipase: 43 U/L (ref 11–51)

## 2019-03-25 MED ORDER — SODIUM CHLORIDE 0.9% FLUSH: 3.0000 mL | Freq: Once | INTRAVENOUS | Status: DC

## 2019-03-25 MED ORDER — SODIUM CHLORIDE 0.9 % IV BOLUS
1000.0000 mL | Freq: Once | INTRAVENOUS | Status: AC
  Administered 2019-03-25: 19:00:00 1000 mL via INTRAVENOUS

## 2019-03-25 MED ORDER — METOCLOPRAMIDE HCL 10 MG PO TABS: 10.0000 mg | ORAL_TABLET | Freq: Four times a day (QID) | ORAL | 0 refills | Status: DC | PRN

## 2019-03-25 MED ORDER — METOCLOPRAMIDE HCL 5 MG/ML IJ SOLN
20.0000 mg | Freq: Once | INTRAVENOUS | Status: AC
  Administered 2019-03-25: 20 mg via INTRAVENOUS
  Filled 2019-03-25: qty 4

## 2019-03-25 NOTE — ED Triage Notes (Signed)
Patient is about [redacted] weeks pregnant.  OBGYN in [facility].  Arrives today c/o hyperemesis.  Last seen by OBGYN on 9/14.

## 2019-03-25 NOTE — ED Provider Notes (Signed)
[facility] Emergency Department Provider Note  Time seen: 6:56 PM  I have reviewed the triage vital signs and the nursing notes.   HISTORY  Chief Complaint Emesis During Pregnancy   HPI Claire Curtis is a 33 y.o. female [id number] approximate [redacted] weeks pregnant presents to the emergency department for nausea vomiting.  According to the patient over the past 3 to 4 weeks she has had persistent nausea with worsening vomiting especially over the last couple weeks.  Patient was seen by urgent care and prescribed Zofran.  Patient was seen approximately 1 week ago by her OB/GYN at [facility] and was told to stop the Zofran and start Unisom/B6.  Patient states she has not been able to keep down the Unisom/B6, has not been able to keep down any liquids today at all so she came to the emergency department for evaluation.  Patient denies any significant abdominal pain besides occasional cramping.  Denies any bleeding or vaginal discharge.  Patient states she had an ultrasound performed during her last OB appointment.  Past Medical History:  Diagnosis Date  . Herpes    last outbreak, last year  . Vaginal Pap smear, abnormal   . Warts, genital     Patient Active Problem List   Diagnosis Date Noted  . Acute appendicitis 03/17/2018  . Acute appendicitis, uncomplicated   . Supervision of normal subsequent pregnancy 10/17/2013  . History of cesarean section 10/17/2013  . Nausea and vomiting in pregnancy prior to [redacted] weeks gestation 10/17/2013    Past Surgical History:  Procedure Laterality Date  . APPENDECTOMY    . CESAREAN SECTION    . LAPAROSCOPIC APPENDECTOMY N/A 03/17/2018   Procedure: APPENDECTOMY LAPAROSCOPIC;  Surgeon: Florene Glen, MD;  Location: [facility] ORS;  Service: General;  Laterality: N/A;    Prior to Admission medications   Medication Sig Start Date End Date Taking? Authorizing Provider  ondansetron (ZOFRAN ODT) 4 MG disintegrating tablet Take 1  tablet (4 mg total) by mouth every 8 (eight) hours as needed for nausea or vomiting. 03/08/19   Loura Halt A, NP  Prenatal Vit-Fe Fumarate-FA (MULTIVITAMIN-PRENATAL) 27-0.8 MG TABS tablet Take 1 tablet by mouth daily at 12 noon.    [provider]  valACYclovir (VALTREX) 500 MG tablet Take 500 mg by mouth as needed.    [provider]    No Known Allergies  Family History  Problem Relation Age of Onset  . Healthy Mother   . Healthy Father     Social History Social History   Tobacco Use  . Smoking status: Never Smoker  . Smokeless tobacco: Never Used  Substance Use Topics  . Alcohol use: Yes    Comment: occasionally  . Drug use: No    Review of Systems Constitutional: Negative for fever. Cardiovascular: Negative for chest pain. Respiratory: Negative for shortness of breath. Gastrointestinal: Intermittent abdominal cramping.  Positive for nausea vomiting.  Negative for diarrhea. Genitourinary: Negative for vaginal bleeding or discharge. Musculoskeletal: Negative for musculoskeletal complaints Neurological: Negative for headache All other ROS negative  ____________________________________________   PHYSICAL EXAM:  VITAL SIGNS: ED Triage Vitals  Enc Vitals Group     BP 03/25/19 1702 124/83     Pulse Rate 03/25/19 1702 100     Resp 03/25/19 1702 18     Temp 03/25/19 1702 98.4 F (36.9 C)     Temp Source 03/25/19 1702 Oral     SpO2 03/25/19 1702 100 %     Weight --  Height --      Head Circumference --      Peak Flow --      Pain Score 03/25/19 1712 0     Pain Loc --      Pain Edu? --      Excl. in GC? --    Constitutional: Alert and oriented. Well appearing and in no distress. Eyes: Normal exam ENT      Head: Normocephalic and atraumatic.      Mouth/Throat: Mucous membranes are moist. Cardiovascular: Normal rate, regular rhythm. No murmur Respiratory: Normal respiratory effort without tachypnea nor retractions. Breath sounds are  clear Gastrointestinal: Soft and nontender. No distention. Musculoskeletal: Nontender with normal range of motion in all extremities. Neurologic:  Normal speech and language. No gross focal neurologic deficits  Skin:  Skin is warm, dry and intact.  Psychiatric: Mood and affect are normal.   ____________________________________________   INITIAL IMPRESSION / ASSESSMENT AND PLAN / ED COURSE  Pertinent labs & imaging results that were available during my care of the patient were reviewed by me and considered in my medical decision making (see chart for details).   Patient presents emergency department for nausea vomiting approximately [redacted] weeks pregnant.  Patient states a history of nausea and vomiting with her first pregnancy as well but states this is worse.  No abdominal pain, benign abdominal exam.  Patient's lab work shows 80+ ketones in the urine consistent with significant dehydration however normal anion gap.  We will dose Reglan, IV fluids and continue to closely monitor the patient.  As long as the patient is able to tolerate p.o. anticipate likely discharge home with Reglan as needed in addition to her B6 and Unisom which the patient will be taking nightly.  Patient states she will follow-up with her OB.  Claire RogersDominique Demchak was evaluated in Emergency Department on 03/25/2019 for the symptoms described in the history of present illness. She was evaluated in the context of the global COVID-19 pandemic, which necessitated consideration that the patient might be at risk for infection with the SARS-CoV-2 virus that causes COVID-19. Institutional protocols and algorithms that pertain to the evaluation of patients at risk for COVID-19 are in a state of rapid change based on information released by regulatory bodies including the CDC and federal and state organizations. These policies and algorithms were followed during the patient's care in the  ED.  ____________________________________________   FINAL CLINICAL IMPRESSION(S) / ED DIAGNOSES  Hyperemesis gravidarum   Minna AntisPaduchowski, Coby Shrewsberry, MD 03/26/19 2057

## 2019-03-25 NOTE — Discharge Instructions (Signed)
Please follow-up with your OB/GYN this coming week for recheck/reevaluation.  As we discussed please take your Unisom/vitamin B6 at night.  Please use your Reglan as needed but only as prescribed.  Return to the emergency department for any significant nausea vomiting unable to keep down fluids, or any other symptom personally concerning to yourself.

## 2019-03-25 NOTE — ED Notes (Signed)
Pt provided with ice chips

## 2020-02-04 ENCOUNTER — Other Ambulatory Visit: Payer: Self-pay

## 2020-02-04 ENCOUNTER — Ambulatory Visit
Admission: EM | Admit: 2020-02-04 | Discharge: 2020-02-04 | Disposition: A | Discharge status: Home / Self Care | Payer: BC Managed Care – PPO | Attending: Family Medicine | Admitting: Family Medicine

## 2020-02-04 ENCOUNTER — Encounter: Payer: Self-pay | Admitting: Emergency Medicine

## 2020-02-04 DIAGNOSIS — H6501 Acute serous otitis media, right ear: Secondary | ICD-10-CM | POA: Diagnosis not present

## 2020-02-04 MED ORDER — AMOXICILLIN 875 MG PO TABS: 875.0000 mg | ORAL_TABLET | Freq: Two times a day (BID) | ORAL | 0 refills | Status: DC

## 2020-02-04 NOTE — ED Triage Notes (Signed)
Patient c/o right ear pain that started 2 months ago. States she did not go to work today because of the pain.

## 2020-02-04 NOTE — ED Provider Notes (Signed)
[facility]    CSN: [id number] Arrival date & time: 02/04/20  1741      History   Chief Complaint Chief Complaint  Patient presents with  . Otalgia    HPI Claire Curtis is a 34 y.o. female.   34 yo female with a c/o right ear pain for the past several months but worse over the past couple of days. States it feels full/pressure. Denies any drainage, fevers, chills, injuries.    Otalgia   Past Medical History:  Diagnosis Date  . Herpes    last outbreak, last year  . Vaginal Pap smear, abnormal   . Warts, genital     Patient Active Problem List   Diagnosis Date Noted  . Acute appendicitis 03/17/2018  . Acute appendicitis, uncomplicated   . Supervision of normal subsequent pregnancy 10/17/2013  . History of cesarean section 10/17/2013  . Nausea and vomiting in pregnancy prior to [redacted] weeks gestation 10/17/2013    Past Surgical History:  Procedure Laterality Date  . APPENDECTOMY    . CESAREAN SECTION    . LAPAROSCOPIC APPENDECTOMY N/A 03/17/2018   Procedure: APPENDECTOMY LAPAROSCOPIC;  Surgeon: Lattie Haw, MD;  Location: [facility] ORS;  Service: General;  Laterality: N/A;    OB History    Gravida  7   Para  1   Term  1   Preterm      AB  4   Living  1     SAB      TAB  4   Ectopic      Multiple      Live Births  1            Home Medications    Prior to Admission medications   Medication Sig Start Date End Date Taking? Authorizing Provider  valACYclovir (VALTREX) 500 MG tablet Take 500 mg by mouth as needed.   Yes [provider]  amoxicillin (AMOXIL) 875 MG tablet Take 1 tablet (875 mg total) by mouth 2 (two) times daily. 02/04/20   Payton Mccallum, MD  metoCLOPramide (REGLAN) 10 MG tablet Take 1 tablet (10 mg total) by mouth every 6 (six) hours as needed for nausea. 03/25/19   Minna Antis, MD  ondansetron (ZOFRAN ODT) 4 MG disintegrating tablet Take 1 tablet (4 mg total) by mouth every 8 (eight) hours as  needed for nausea or vomiting. 03/08/19   Dahlia Byes A, NP  Prenatal Vit-Fe Fumarate-FA (MULTIVITAMIN-PRENATAL) 27-0.8 MG TABS tablet Take 1 tablet by mouth daily at 12 noon.    [provider]    Family History Family History  Problem Relation Age of Onset  . Healthy Mother   . Healthy Father     Social History Social History   Tobacco Use  . Smoking status: Never Smoker  . Smokeless tobacco: Never Used  Vaping Use  . Vaping Use: Never used  Substance Use Topics  . Alcohol use: Yes    Comment: occasionally  . Drug use: No     Allergies   Patient has no known allergies.   Review of Systems Review of Systems  HENT: Positive for ear pain.      Physical Exam Triage Vital Signs ED Triage Vitals  Enc Vitals Group     BP 02/04/20 1800 (!) 138/99     Pulse Rate 02/04/20 1800 82     Resp 02/04/20 1800 18     Temp 02/04/20 1800 98.7 F (37.1 C)     Temp Source  02/04/20 1800 Oral     SpO2 02/04/20 1800 100 %     Weight 02/04/20 1800 160 lb (72.6 kg)     Height 02/04/20 1800 5\' 4"  (1.626 m)     Head Circumference --      Peak Flow --      Pain Score 02/04/20 1759 8     Pain Loc --      Pain Edu? --      Excl. in GC? --    No data found.  Updated Vital Signs BP (!) 138/99 (BP Location: Right Arm)   Pulse 82   Temp 98.7 F (37.1 C) (Oral)   Resp 18   Ht 5\' 4"  (1.626 m)   Wt 72.6 kg   SpO2 100%   Breastfeeding Yes   BMI 27.46 kg/m   Visual Acuity Right Eye Distance:   Left Eye Distance:   Bilateral Distance:    Right Eye Near:   Left Eye Near:    Bilateral Near:     Physical Exam Vitals and nursing note reviewed.  Constitutional:      General: She is not in acute distress.    Appearance: She is not toxic-appearing or diaphoretic.  HENT:     Left Ear: Tympanic membrane is erythematous and bulging.     Nose: Nose normal.     Mouth/Throat:     Pharynx: Oropharynx is clear.  Neurological:     Mental Status: She is alert.      UC  Treatments / Results  Labs (all labs ordered are listed, but only abnormal results are displayed) Labs Reviewed - No data to display  EKG   Radiology No results found.  Procedures Procedures (including critical care time)  Medications Ordered in UC Medications - No data to display  Initial Impression / Assessment and Plan / UC Course  I have reviewed the triage vital signs and the nursing notes.  Pertinent labs & imaging results that were available during my care of the patient were reviewed by me and considered in my medical decision making (see chart for details).     Final Clinical Impressions(s) / UC Diagnoses   Final diagnoses:  Right acute serous otitis media, recurrence not specified     Discharge Instructions     Tylenol as needed    ED Prescriptions    Medication Sig Dispense Auth. Provider   amoxicillin (AMOXIL) 875 MG tablet Take 1 tablet (875 mg total) by mouth 2 (two) times daily. 20 tablet 04/05/20, MD      1. diagnosis reviewed with patient 2. rx as per orders above; reviewed possible side effects, interactions, risks and benefits  3. Recommend supportive treatment as above 4. Follow-up prn if symptoms worsen or don't improve  PDMP not reviewed this encounter.   , MD 02/04/20 [phone]

## 2020-02-04 NOTE — Discharge Instructions (Signed)
Tylenol as needed.

## 2020-02-23 ENCOUNTER — Other Ambulatory Visit: Payer: Self-pay

## 2020-02-23 ENCOUNTER — Ambulatory Visit
Admission: EM | Admit: 2020-02-23 | Discharge: 2020-02-23 | Disposition: A | Discharge status: Home / Self Care | Payer: BC Managed Care – PPO | Attending: Family Medicine | Admitting: Family Medicine

## 2020-02-23 ENCOUNTER — Encounter: Payer: Self-pay | Admitting: Emergency Medicine

## 2020-02-23 DIAGNOSIS — Z20822 Contact with and (suspected) exposure to covid-19: Secondary | ICD-10-CM | POA: Diagnosis not present

## 2020-02-23 DIAGNOSIS — Z79899 Other long term (current) drug therapy: Secondary | ICD-10-CM | POA: Insufficient documentation

## 2020-02-23 DIAGNOSIS — R519 Headache, unspecified: Secondary | ICD-10-CM | POA: Insufficient documentation

## 2020-02-23 DIAGNOSIS — R5383 Other fatigue: Secondary | ICD-10-CM

## 2020-02-23 DIAGNOSIS — R197 Diarrhea, unspecified: Secondary | ICD-10-CM

## 2020-02-23 NOTE — ED Triage Notes (Signed)
Patient c/o headache, diarrhea, and fatigue that started on Thursday.  Patient denies fevers.

## 2020-02-23 NOTE — ED Provider Notes (Signed)
[facility]    CSN: [id number] Arrival date & time: 02/23/20  1408      History   Chief Complaint Chief Complaint  Patient presents with   Headache   Diarrhea    HPI Claire Curtis is a 34 y.o. female.   34 y/o female presents for diarrhea x 2 days. Admits to about 3-4 episodes of watery and loose stools per day. Denies fever or blood in stool. Has been taking pepto bismol with some improvement in symptoms. Patient says she also has a little bit of headache and is feeling fatigued. She says she has been increasing fluids and drinking pedialyte. Denies nausea/vomiting.  Denies cough, sore throat, congestion. Admits to abdominal cramping that comes and goes. Denies painful urination. LMP 01/27/2020. She was seen in the clinic 3 weeks ago and given amoxicillin for otitis media. She says she only took 3 pills because the ear stopped hurting. Denies known COVID exposure. Patient is not vaccinated. No other concerns today.     Past Medical History:  Diagnosis Date   Herpes    last outbreak, last year   Vaginal Pap smear, abnormal    Warts, genital     Patient Active Problem List   Diagnosis Date Noted   Acute appendicitis 03/17/2018   Acute appendicitis, uncomplicated    Supervision of normal subsequent pregnancy 10/17/2013   History of cesarean section 10/17/2013   Nausea and vomiting in pregnancy prior to [redacted] weeks gestation 10/17/2013    Past Surgical History:  Procedure Laterality Date   APPENDECTOMY     CESAREAN SECTION     LAPAROSCOPIC APPENDECTOMY N/A 03/17/2018   Procedure: APPENDECTOMY LAPAROSCOPIC;  Surgeon: Lattie Haw, MD;  Location: [facility] ORS;  Service: General;  Laterality: N/A;    OB History    Gravida  7   Para  1   Term  1   Preterm      AB  4   Living  1     SAB      TAB  4   Ectopic      Multiple      Live Births  1            Home Medications    Prior to Admission medications   Medication  Sig Start Date End Date Taking? Authorizing Provider  Prenatal Vit-Fe Fumarate-FA (MULTIVITAMIN-PRENATAL) 27-0.8 MG TABS tablet Take 1 tablet by mouth daily at 12 noon.   Yes [provider]  valACYclovir (VALTREX) 500 MG tablet Take 500 mg by mouth as needed.   Yes [provider]  amoxicillin (AMOXIL) 875 MG tablet Take 1 tablet (875 mg total) by mouth 2 (two) times daily. 02/04/20   Payton Mccallum, MD  metoCLOPramide (REGLAN) 10 MG tablet Take 1 tablet (10 mg total) by mouth every 6 (six) hours as needed for nausea. 03/25/19   Minna Antis, MD  ondansetron (ZOFRAN ODT) 4 MG disintegrating tablet Take 1 tablet (4 mg total) by mouth every 8 (eight) hours as needed for nausea or vomiting. 03/08/19   Janace Aris, NP    Family History Family History  Problem Relation Age of Onset   Healthy Mother    Healthy Father     Social History Social History   Tobacco Use   Smoking status: Never Smoker   Smokeless tobacco: Never Used  Building services engineer Use: Never used  Substance Use Topics   Alcohol use: Yes    Comment: occasionally  Drug use: No     Allergies   Penicillins   Review of Systems Review of Systems  Constitutional: Positive for fatigue. Negative for chills, diaphoresis and fever.  HENT: Negative for congestion, ear pain, rhinorrhea, sinus pressure, sinus pain and sore throat.   Respiratory: Negative for cough and shortness of breath.   Gastrointestinal: Positive for abdominal pain (cramping, mild) and diarrhea. Negative for nausea and vomiting.  Musculoskeletal: Negative for arthralgias and myalgias.  Skin: Negative for rash.  Neurological: Positive for headaches. Negative for weakness.  Hematological: Negative for adenopathy.     Physical Exam Triage Vital Signs ED Triage Vitals  Enc Vitals Group     BP 02/23/20 1454 (!) 146/86     Pulse Rate 02/23/20 1454 93     Resp 02/23/20 1454 14     Temp 02/23/20 1454 99 F (37.2 C)      Temp Source 02/23/20 1454 Oral     SpO2 02/23/20 1454 100 %     Weight 02/23/20 1451 167 lb (75.8 kg)     Height 02/23/20 1451 5\' 4"  (1.626 m)     Head Circumference --      Peak Flow --      Pain Score 02/23/20 1451 7     Pain Loc --      Pain Edu? --      Excl. in GC? --    No data found.  Updated Vital Signs BP (!) 146/86 (BP Location: Left Arm)    Pulse 93    Temp 99 F (37.2 C) (Oral)    Resp 14    Ht 5\' 4"  (1.626 m)    Wt 167 lb (75.8 kg)    LMP 01/27/2020 (Exact Date)    SpO2 100%    Breastfeeding Yes    BMI 28.67 kg/m       Physical Exam Vitals and nursing note reviewed.  Constitutional:      General: She is not in acute distress.    Appearance: Normal appearance. She is not ill-appearing or toxic-appearing.  HENT:     Head: Normocephalic and atraumatic.     Right Ear: Tympanic membrane, ear canal and external ear normal.     Left Ear: Tympanic membrane, ear canal and external ear normal.     Nose: Nose normal.     Mouth/Throat:     Mouth: Mucous membranes are moist.     Pharynx: Oropharynx is clear.  Eyes:     General: No scleral icterus.       Right eye: No discharge.        Left eye: No discharge.     Conjunctiva/sclera: Conjunctivae normal.  Cardiovascular:     Rate and Rhythm: Normal rate and regular rhythm.     Heart sounds: Normal heart sounds.  Pulmonary:     Effort: Pulmonary effort is normal. No respiratory distress.     Breath sounds: Normal breath sounds.  Abdominal:     Palpations: Abdomen is soft.     Tenderness: There is abdominal tenderness (mild diffuse lower abdominal pain with most TTP of LLQ). There is no guarding or rebound.  Musculoskeletal:     Cervical back: Neck supple.  Skin:    General: Skin is dry.  Neurological:     General: No focal deficit present.     Mental Status: She is alert. Mental status is at baseline.     Motor: No weakness.     Gait: Gait normal.  Psychiatric:  Mood and Affect: Mood normal.         Behavior: Behavior normal.        Thought Content: Thought content normal.      UC Treatments / Results  Labs (all labs ordered are listed, but only abnormal results are displayed) Labs Reviewed  SARS CORONAVIRUS 2 (TAT 6-24 HRS)    EKG   Radiology No results found.  Procedures Procedures (including critical care time)  Medications Ordered in UC Medications - No data to display  Initial Impression / Assessment and Plan / UC Course  I have reviewed the triage vital signs and the nursing notes.  Pertinent labs & imaging results that were available during my care of the patient were reviewed by me and considered in my medical decision making (see chart for details).   34 y/o female with diarrhea for 2 days. Vitals are stable and exam only reveals mild TTP of lower abdomen. COVID test performed but low suspicion for COVID based on H&P. Patient has history of IBS and is near to starting menses. Also symptoms could be viral gastroenteritis. In any case, advised rest, fluids, Imodium since she has no fever or blood in stool. F/u as needed for new/worsening symptoms. Pt given work note as requested.  Final Clinical Impressions(s) / UC Diagnoses   Final diagnoses:  Diarrhea, unspecified type  Fatigue, unspecified type     Discharge Instructions     -COVID test obtained.  -Increase fluids and try Imodium. If fever or blood in stool, stop and seek re-evaluation      ED Prescriptions    None     PDMP not reviewed this encounter.   Shirlee Latch, PA-C 02/23/20 [phone]

## 2020-02-23 NOTE — Discharge Instructions (Signed)
-  COVID test obtained.  -Increase fluids and try Imodium. If fever or blood in stool, stop and seek re-evaluation

## 2020-02-24 LAB — SARS CORONAVIRUS 2 (TAT 6-24 HRS): SARS Coronavirus 2: NEGATIVE

## 2020-03-06 ENCOUNTER — Other Ambulatory Visit: Payer: Self-pay

## 2020-03-06 ENCOUNTER — Ambulatory Visit
Admission: EM | Admit: 2020-03-06 | Discharge: 2020-03-06 | Disposition: A | Discharge status: Home / Self Care | Payer: BC Managed Care – PPO | Attending: Family Medicine | Admitting: Family Medicine

## 2020-03-06 ENCOUNTER — Encounter: Payer: Self-pay | Admitting: Emergency Medicine

## 2020-03-06 DIAGNOSIS — Z880 Allergy status to penicillin: Secondary | ICD-10-CM | POA: Insufficient documentation

## 2020-03-06 DIAGNOSIS — J988 Other specified respiratory disorders: Secondary | ICD-10-CM | POA: Insufficient documentation

## 2020-03-06 DIAGNOSIS — J029 Acute pharyngitis, unspecified: Secondary | ICD-10-CM | POA: Insufficient documentation

## 2020-03-06 DIAGNOSIS — Z9049 Acquired absence of other specified parts of digestive tract: Secondary | ICD-10-CM | POA: Diagnosis not present

## 2020-03-06 DIAGNOSIS — Z79899 Other long term (current) drug therapy: Secondary | ICD-10-CM | POA: Diagnosis not present

## 2020-03-06 DIAGNOSIS — Z791 Long term (current) use of non-steroidal anti-inflammatories (NSAID): Secondary | ICD-10-CM | POA: Diagnosis not present

## 2020-03-06 DIAGNOSIS — Z20822 Contact with and (suspected) exposure to covid-19: Secondary | ICD-10-CM | POA: Insufficient documentation

## 2020-03-06 DIAGNOSIS — B9789 Other viral agents as the cause of diseases classified elsewhere: Secondary | ICD-10-CM | POA: Diagnosis not present

## 2020-03-06 LAB — SARS CORONAVIRUS 2 (TAT 6-24 HRS): SARS Coronavirus 2: NEGATIVE

## 2020-03-06 LAB — GROUP A STREP BY PCR: Group A Strep by PCR: NOT DETECTED

## 2020-03-06 NOTE — ED Provider Notes (Signed)
[facility]    CSN: [id number] Arrival date & time: 03/06/20  1007      History   Chief Complaint Chief Complaint  Patient presents with   Sore Throat   HPI  34 year old female presents with sore throat, headache, congestion, cough, body aches.  Patient reports that her symptoms started approximate 2 days ago.  She reports headache, sore throat, congestion, cough, body aches.  Most troublesome symptom is sore throat.  Sore throat is rated at 10/10 in severity.  No documented fever.  No reported sick contacts.  Patient is currently breast-feeding.  No relieving factors.  No other associated symptoms.  Past Medical History:  Diagnosis Date   Herpes    last outbreak, last year   Vaginal Pap smear, abnormal    Warts, genital     Patient Active Problem List   Diagnosis Date Noted   Acute appendicitis 03/17/2018   Acute appendicitis, uncomplicated    Supervision of normal subsequent pregnancy 10/17/2013   History of cesarean section 10/17/2013   Nausea and vomiting in pregnancy prior to [redacted] weeks gestation 10/17/2013    Past Surgical History:  Procedure Laterality Date   APPENDECTOMY     CESAREAN SECTION     LAPAROSCOPIC APPENDECTOMY N/A 03/17/2018   Procedure: APPENDECTOMY LAPAROSCOPIC;  Surgeon: Lattie Haw, MD;  Location: [facility];  Service: General;  Laterality: N/A;    OB History    Gravida  7   Para  1   Term  1   Preterm      AB  4   Living  1     SAB      TAB  4   Ectopic      Multiple      Live Births  1            Home Medications    Prior to Admission medications   Medication Sig Start Date End Date Taking? Authorizing Provider  Prenatal Vit-Fe Fumarate-FA (MULTIVITAMIN-PRENATAL) 27-0.8 MG TABS tablet Take 1 tablet by mouth daily at 12 noon.   Yes [provider]  valACYclovir (VALTREX) 500 MG tablet Take 500 mg by mouth as needed.   Yes [provider]  metoCLOPramide (REGLAN) 10 MG  tablet Take 1 tablet (10 mg total) by mouth every 6 (six) hours as needed for nausea. 03/25/19 03/06/20  Minna Antis, MD    Family History Family History  Problem Relation Age of Onset   Healthy Mother    Healthy Father     Social History Social History   Tobacco Use   Smoking status: Never Smoker   Smokeless tobacco: Never Used  Building services engineer Use: Never used  Substance Use Topics   Alcohol use: Yes    Comment: occasionally   Drug use: No     Allergies   Penicillins   Review of Systems Review of Systems  Constitutional: Negative for fever.  HENT: Positive for congestion and sore throat.   Respiratory: Positive for cough.   Musculoskeletal:       Body aches.  Neurological: Positive for headaches.   Physical Exam Triage Vital Signs ED Triage Vitals  Enc Vitals Group     BP 03/06/20 1200 128/88     Pulse Rate 03/06/20 1200 (!) 113     Resp 03/06/20 1200 18     Temp 03/06/20 1200 98.9 F (37.2 C)     Temp Source 03/06/20 1200 Oral     SpO2 03/06/20  1200 100 %     Weight 03/06/20 1157 167 lb 1.7 oz (75.8 kg)     Height 03/06/20 1157 5\' 4"  (1.626 m)     Head Circumference --      Peak Flow --      Pain Score 03/06/20 1157 10     Pain Loc --      Pain Edu? --      Excl. in GC? --    Updated Vital Signs BP 128/88 (BP Location: Left Arm)    Pulse (!) 113    Temp 98.9 F (37.2 C) (Oral)    Resp 18    Ht 5\' 4"  (1.626 m)    Wt 75.8 kg    LMP 02/25/2020    SpO2 100%    Breastfeeding Yes    BMI 28.68 kg/m   Visual Acuity Right Eye Distance:   Left Eye Distance:   Bilateral Distance:    Right Eye Near:   Left Eye Near:    Bilateral Near:     Physical Exam Vitals and nursing note reviewed.  Constitutional:      General: She is not in acute distress.    Appearance: Normal appearance. She is not ill-appearing.  HENT:     Head: Normocephalic and atraumatic.     Mouth/Throat:     Pharynx: Posterior oropharyngeal erythema present. No  oropharyngeal exudate.  Cardiovascular:     Rate and Rhythm: Regular rhythm. Tachycardia present.  Pulmonary:     Effort: Pulmonary effort is normal.     Breath sounds: Normal breath sounds. No wheezing, rhonchi or rales.  Neurological:     Mental Status: She is alert.  Psychiatric:        Mood and Affect: Mood normal.        Behavior: Behavior normal.    UC Treatments / Results  Labs (all labs ordered are listed, but only abnormal results are displayed) Labs Reviewed  GROUP A STREP BY PCR  SARS CORONAVIRUS 2 (TAT 6-24 HRS)    EKG   Radiology No results found.  Procedures Procedures (including critical care time)  Medications Ordered in UC Medications - No data to display  Initial Impression / Assessment and Plan / UC Course  I have reviewed the triage vital signs and the nursing notes.  Pertinent labs & imaging results that were available during my care of the patient were reviewed by me and considered in my medical decision making (see chart for details).    34 year old female presents with a viral respiratory infection.  Concern for COVID-19.  Strep PCR negative.  Given the fact that she is breast-feeding, I have advised over-the-counter treatment with Robitussin and ibuprofen.  Final Clinical Impressions(s) / UC Diagnoses   Final diagnoses:  Viral respiratory infection  Pharyngitis, unspecified etiology     Discharge Instructions     Lots of fluids.  Robitussin and ibuprofen as needed.  Take care  Dr. 02/27/2020    ED Prescriptions    None     PDMP not reviewed this encounter.   32, DO 03/06/20 1344

## 2020-03-06 NOTE — ED Triage Notes (Signed)
Pt c/o headache, sore throat, nasal congestion, cough, and body aches. Started about 2 days ago. Denies fever.

## 2020-03-06 NOTE — Discharge Instructions (Signed)
Lots of fluids.  Robitussin and ibuprofen as needed.  Take care  Dr. Adriana Simas

## 2021-01-25 ENCOUNTER — Ambulatory Visit
Admission: EM | Admit: 2021-01-25 | Discharge: 2021-01-25 | Disposition: A | Discharge status: Home / Self Care | Payer: Commercial Managed Care - HMO | Attending: Physician Assistant | Admitting: Physician Assistant

## 2021-01-25 ENCOUNTER — Other Ambulatory Visit: Payer: Self-pay

## 2021-01-25 ENCOUNTER — Encounter: Payer: Self-pay | Admitting: Emergency Medicine

## 2021-01-25 DIAGNOSIS — N898 Other specified noninflammatory disorders of vagina: Secondary | ICD-10-CM | POA: Diagnosis not present

## 2021-01-25 DIAGNOSIS — R300 Dysuria: Secondary | ICD-10-CM | POA: Diagnosis not present

## 2021-01-25 DIAGNOSIS — A6000 Herpesviral infection of urogenital system, unspecified: Secondary | ICD-10-CM | POA: Diagnosis not present

## 2021-01-25 DIAGNOSIS — N760 Acute vaginitis: Secondary | ICD-10-CM | POA: Diagnosis not present

## 2021-01-25 LAB — POCT URINALYSIS DIP (DEVICE)
Bilirubin Urine: NEGATIVE
Glucose, UA: NEGATIVE mg/dL
Ketones, ur: NEGATIVE mg/dL
Leukocytes,Ua: NEGATIVE
Nitrite: NEGATIVE
Protein, ur: NEGATIVE mg/dL
Specific Gravity, Urine: >=1.03 (ref 1.005–1.030)
Urobilinogen, UA: 0.2 mg/dL (ref 0.0–1.0)
pH: 5.5 (ref 5.0–8.0)

## 2021-01-25 MED ORDER — METRONIDAZOLE 0.75 % VA GEL: 1.0000 | Freq: Every day | VAGINAL | 0 refills | Status: AC

## 2021-01-25 MED ORDER — FLUCONAZOLE 150 MG PO TABS: 150.0000 mg | ORAL_TABLET | Freq: Every day | ORAL | 0 refills | Status: AC

## 2021-01-25 MED ORDER — VALACYCLOVIR HCL 500 MG PO TABS: 500.0000 mg | ORAL_TABLET | Freq: Two times a day (BID) | ORAL | 0 refills | Status: AC

## 2021-01-25 NOTE — ED Provider Notes (Signed)
[facility]    CSN: [id number] Arrival date & time: 01/25/21  1121      History   Chief Complaint Chief Complaint  Patient presents with   Vaginal Discharge    HPI Claire Curtis is a 35 y.o. female presenting for approximately 2-day history of vaginal discharge and irritation.  Admits to some mild itching.  States that it occasionally burns when she urinates.  She says that she also feels a fullness.  She has had some vaginal discharge but says she has not noticed an odor and just not really discolored.  She admits to similar symptoms in the past when she said bacterial vaginosis.  She would also like STI testing today but states she is married and does not have high concern for STI.  Patient is currently breast-feeding her youngest son.  Her last menstrual period was a couple of weeks ago and lasted for about a days.  She presently admits to intermittent lower abdominal cramping but no significant pain.  No back pain.  No hematuria, urinary frequency or urgency.  No nausea or vomiting.  No other complaints or concerns.  HPI  Past Medical History:  Diagnosis Date   Herpes    last outbreak, last year   Vaginal Pap smear, abnormal    Warts, genital     Patient Active Problem List   Diagnosis Date Noted   Acute appendicitis 03/17/2018   Acute appendicitis, uncomplicated    Supervision of normal subsequent pregnancy 10/17/2013   History of cesarean section 10/17/2013   Nausea and vomiting in pregnancy prior to [redacted] weeks gestation 10/17/2013    Past Surgical History:  Procedure Laterality Date   APPENDECTOMY     CESAREAN SECTION     LAPAROSCOPIC APPENDECTOMY N/A 03/17/2018   Procedure: APPENDECTOMY LAPAROSCOPIC;  Surgeon: Lattie Haw, MD;  Location: [facility];  Service: General;  Laterality: N/A;    OB History     Gravida  7   Para  1   Term  1   Preterm      AB  4   Living  1      SAB      IAB  4   Ectopic      Multiple      Live  Births  1            Home Medications    Prior to Admission medications   Medication Sig Start Date End Date Taking? Authorizing Provider  fluconazole (DIFLUCAN) 150 MG tablet Take 1 tablet (150 mg total) by mouth daily for 1 day. 01/25/21 01/26/21 Yes Eusebio Friendly B, PA-C  metroNIDAZOLE (METROGEL VAGINAL) 0.75 % vaginal gel Place 1 Applicatorful vaginally at bedtime for 5 days. 01/25/21 01/30/21 Yes Shirlee Latch, PA-C  Prenatal Vit-Fe Fumarate-FA (MULTIVITAMIN-PRENATAL) 27-0.8 MG TABS tablet Take 1 tablet by mouth daily at 12 noon.   Yes [provider]  valACYclovir (VALTREX) 500 MG tablet Take 500 mg by mouth as needed.    [provider]  metoCLOPramide (REGLAN) 10 MG tablet Take 1 tablet (10 mg total) by mouth every 6 (six) hours as needed for nausea. 03/25/19 03/06/20  Minna Antis, MD    Family History Family History  Problem Relation Age of Onset   Healthy Mother    Healthy Father     Social History Social History   Tobacco Use   Smoking status: Never   Smokeless tobacco: Never  Vaping Use   Vaping Use: Never used  Substance Use Topics   Alcohol use: Yes    Comment: occasionally   Drug use: No     Allergies   Penicillins   Review of Systems Review of Systems  Constitutional:  Negative for fatigue and fever.  Gastrointestinal:  Positive for abdominal pain. Negative for nausea and vomiting.  Genitourinary:  Positive for dysuria and vaginal discharge. Negative for flank pain, frequency, hematuria, urgency, vaginal bleeding and vaginal pain.  Musculoskeletal:  Negative for back pain.  Skin:  Negative for rash.    Physical Exam Triage Vital Signs ED Triage Vitals  Enc Vitals Group     BP 01/25/21 1226 (!) 145/95     Pulse Rate 01/25/21 1226 97     Resp 01/25/21 1226 14     Temp 01/25/21 1226 98.2 F (36.8 C)     Temp Source 01/25/21 1226 Oral     SpO2 01/25/21 1226 98 %     Weight 01/25/21 1222 174 lb (78.9 kg)     Height  01/25/21 1222 5\' 4"  (1.626 m)     Head Circumference --      Peak Flow --      Pain Score 01/25/21 1222 8     Pain Loc --      Pain Edu? --      Excl. in GC? --    No data found.  Updated Vital Signs BP (!) 145/95 (BP Location: Left Arm)   Pulse 97   Temp 98.2 F (36.8 C) (Oral)   Resp 14   Ht 5\' 4"  (1.626 m)   Wt 174 lb (78.9 kg)   LMP 01/14/2021   SpO2 98%   Breastfeeding Yes   BMI 29.87 kg/m      Physical Exam Vitals and nursing note reviewed.  Constitutional:      General: She is not in acute distress.    Appearance: Normal appearance. She is not ill-appearing or toxic-appearing.  HENT:     Head: Normocephalic and atraumatic.  Eyes:     General: No scleral icterus.       Right eye: No discharge.        Left eye: No discharge.     Conjunctiva/sclera: Conjunctivae normal.  Cardiovascular:     Rate and Rhythm: Normal rate and regular rhythm.     Heart sounds: Normal heart sounds.  Pulmonary:     Effort: Pulmonary effort is normal. No respiratory distress.     Breath sounds: Normal breath sounds.  Abdominal:     Palpations: Abdomen is soft.     Tenderness: There is abdominal tenderness (Mild TTP LLQ).  Genitourinary:    Comments: deferred Musculoskeletal:     Cervical back: Neck supple.  Skin:    General: Skin is dry.  Neurological:     General: No focal deficit present.     Mental Status: She is alert. Mental status is at baseline.     Motor: No weakness.     Gait: Gait normal.  Psychiatric:        Mood and Affect: Mood normal.        Behavior: Behavior normal.        Thought Content: Thought content normal.     UC Treatments / Results  Labs (all labs ordered are listed, but only abnormal results are displayed) Labs Reviewed  POCT URINALYSIS DIP (DEVICE) - Abnormal; Notable for the following components:      Result Value   Hgb urine dipstick SMALL (*)  All other components within normal limits  URINE CULTURE  POCT URINALYSIS DIPSTICK, ED /  UC  CERVICOVAGINAL ANCILLARY ONLY    EKG   Radiology No results found.  Procedures Procedures (including critical care time)  Medications Ordered in UC Medications - No data to display  Initial Impression / Assessment and Plan / UC Course  I have reviewed the triage vital signs and the nursing notes.  Pertinent labs & imaging results that were available during my care of the patient were reviewed by me and considered in my medical decision making (see chart for details).  35 year old female presenting for vaginal discharge and irritation.  Also must of dysuria.  Vitals are all normal and stable.  She has mild tenderness to palpation of left lower quadrant.  She elects to forego the pelvic exam as she has her young child with her.  She decides to perform a vaginal swab herself.  Vaginal swab sent for GC/chlamydia/trichomonas/BV/yeast.  Urinalysis today shows trace blood, otherwise normal.  We will culture urine to ensure no UTI.  Suspect based on her symptoms she likely has BV and/or yeast.  Sent in MetroGel since she is breast-feeding and Diflucan.  Advised her to follow-up on MyChart for results.  So we will contact her if anything is positive.  Patient requests refill of Valtrex.  She has a history of genital herpes.  Not currently having outbreak but would like to have some on hand.  Sent refill.   Final Clinical Impressions(s) / UC Diagnoses   Final diagnoses:  Acute vaginitis  Vaginal discharge  Dysuria     Discharge Instructions      The results of your vaginal swab will be back in a couple of days.  I have sent medication to treat you for bacterial vaginosis and yeast infection which seem to be less likely given your symptoms.  If it is positive for gonorrhea or chlamydia you will need other medications and we will let you know.  Your results will be on MyChart.     ED Prescriptions     Medication Sig Dispense Auth. Provider   fluconazole (DIFLUCAN) 150 MG  tablet Take 1 tablet (150 mg total) by mouth daily for 1 day. 1 tablet Eusebio Friendly B, PA-C   metroNIDAZOLE (METROGEL VAGINAL) 0.75 % vaginal gel Place 1 Applicatorful vaginally at bedtime for 5 days. 70 g Shirlee Latch, PA-C      PDMP not reviewed this encounter.   Shirlee Latch, PA-C 01/25/21 1313

## 2021-01-25 NOTE — ED Triage Notes (Signed)
Patient c/o vaginal discharge and vaginal irritation that started 2 days ago. Patient reports some burning when urinating.  Patient wants to be tested for STD.

## 2021-01-25 NOTE — Discharge Instructions (Addendum)
The results of your vaginal swab will be back in a couple of days.  I have sent medication to treat you for bacterial vaginosis and yeast infection which seem to be less likely given your symptoms.  If it is positive for gonorrhea or chlamydia you will need other medications and we will let you know.  Your results will be on MyChart.

## 2021-01-26 LAB — CERVICOVAGINAL ANCILLARY ONLY
Bacterial Vaginitis (gardnerella): NEGATIVE
Candida Glabrata: NEGATIVE
Candida Vaginitis: NEGATIVE
Chlamydia: NEGATIVE
Comment: NEGATIVE
Comment: NEGATIVE
Comment: NEGATIVE
Comment: NEGATIVE
Comment: NEGATIVE
Comment: NORMAL
Neisseria Gonorrhea: NEGATIVE
Trichomonas: NEGATIVE

## 2021-01-26 LAB — URINE CULTURE: Culture: NO GROWTH

## 2021-09-17 DIAGNOSIS — R69 Illness, unspecified: Secondary | ICD-10-CM | POA: Diagnosis not present

## 2021-09-21 DIAGNOSIS — Z124 Encounter for screening for malignant neoplasm of cervix: Secondary | ICD-10-CM | POA: Diagnosis not present

## 2021-09-21 DIAGNOSIS — Z3202 Encounter for pregnancy test, result negative: Secondary | ICD-10-CM | POA: Diagnosis not present

## 2021-09-21 DIAGNOSIS — Z30430 Encounter for insertion of intrauterine contraceptive device: Secondary | ICD-10-CM | POA: Diagnosis not present

## 2022-04-06 ENCOUNTER — Ambulatory Visit: Payer: Self-pay

## 2022-04-06 NOTE — Telephone Encounter (Signed)
Summary: questions about IUD   Chief Complaint: Vaginal bleeding with IUD  Symptoms: Vaginal bleeding with cramping and clots, HA Frequency: Since IUD placed, monthly . Bleeding this month started on Monday Pertinent Negatives: Patient denies Fever Disposition: [] ED /[] Urgent Care (no appt availability in office) / [x] Appointment(In office/virtual)/ []  [place] [facility]/ [] Home Care/ [] Refused Recommended Disposition /[] [place] [facility]/ []  Follow-up with PCP Additional Notes: Pt states that she had an IUD placed in March, and she had monthly heavy bleeding and Head aches. Pt will follow up with PCP tomorrow.     Pt requests that a nurse return her call because she has questions about the IUD. Cb# [phone]    Reason for Disposition  [1] Bleeding or spotting between regular periods AND [2] occurs more than three cycles (3 months) AND [3] using birth control medicine (pills, patch, Depo-Provera, Implanon, vaginal ring, Mirena IUD)  Answer Assessment - Initial Assessment Questions 1. AMOUNT: "Describe the bleeding that you are having."    - SPOTTING: spotting, or pinkish / brownish mucous discharge; does not fill panty liner or pad    - MILD:  less than 1 pad / hour; less than patient's usual menstrual bleeding   - MODERATE: 1-2 pads / hour; 1 menstrual cup every 6 hours; small-medium blood clots (e.g., pea, grape, small coin)   - SEVERE: soaking 2 or more pads/hour for 2 or more hours; 1 menstrual cup every 2 hours; bleeding not contained by pads or continuous red blood from vagina; large blood clots (e.g., golf ball, large coin)      Moderate 2. ONSET: "When did the bleeding begin?" "Is it continuing now?"     Sunday - Bleeding monthly 3. MENSTRUAL PERIOD: "When was the last normal menstrual period?" "How is this different than your period?"     Heavier than regular period 4. REGULARITY: "How regular are your periods?" irregular      5. ABDOMEN PAIN: "Do you have any  pain?" "How bad is the pain?"  (e.g., Scale 1-10; mild, moderate, or severe)   - MILD (1-3): doesn't interfere with normal activities, abdomen soft and not tender to touch    - MODERATE (4-7): interferes with normal activities or awakens from sleep, abdomen tender to touch    - SEVERE (8-10): excruciating pain, doubled over, unable to do any normal activities      moderate 6. PREGNANCY: "Is there any chance you are pregnant?" "When was your last menstrual period?"     no 7. BREASTFEEDING: "Are you breastfeeding?"     yes 8. HORMONE MEDICINES: "Are you taking any hormone medicines, prescription or over-the-counter?" (e.g., birth control pills, estrogen)     IUD Mirena 9. BLOOD THINNER MEDICINES: "Do you take any blood thinners?" (e.g., Coumadin / warfarin, Pradaxa / dabigatran, aspirin)     no 10. CAUSE: "What do you think is causing the bleeding?" (e.g., recent gyn surgery, recent gyn procedure; known bleeding disorder, cervical cancer, polycystic ovarian disease, fibroids)         IUD 11. HEMODYNAMIC STATUS: "Are you weak or feeling lightheaded?" If Yes, ask: "Can you stand and walk normally?"        no 12. OTHER SYMPTOMS: "What other symptoms are you having with the bleeding?" (e.g., passed tissue, vaginal discharge, fever, menstrual-type cramps)       Passing clots.  Headaches  Protocols used: Vaginal Bleeding - Abnormal-A-AH

## 2022-05-06 ENCOUNTER — Other Ambulatory Visit: Payer: Self-pay

## 2022-05-06 ENCOUNTER — Ambulatory Visit
Admission: EM | Admit: 2022-05-06 | Discharge: 2022-05-06 | Disposition: A | Discharge status: Home / Self Care | Payer: 59

## 2022-05-06 DIAGNOSIS — R519 Headache, unspecified: Secondary | ICD-10-CM

## 2022-05-06 NOTE — ED Provider Notes (Addendum)
[facility]    CSN: [id number] Arrival date & time: 05/06/22  1827      History   Chief Complaint Chief Complaint  Patient presents with   Head Pressure    HPI Claire Curtis is a 36 y.o. female.   Patient presents with intermittent generalized throbbing headaches occurring since March 2023 after IUD placement.  Endorses persistent vaginal bleeding for at least 3 weeks.  Endorses she is unsure if the IUD is still present as she has not been checking placement as she was never taught to do so.  Sexually active, 1 partner.  Concern headache may be related to blood pressure, requesting for it to be checked.  As she is currently lactating has been taking minimal medication for treatment of symptoms, uses Advil as needed.  Denies photophobia, phonophobia, visual disturbance, dizziness, lightheadedness, memory speech changes, general weakness.  Past Medical History:  Diagnosis Date   Herpes    last outbreak, last year   Vaginal Pap smear, abnormal    Warts, genital     Patient Active Problem List   Diagnosis Date Noted   Acute appendicitis 03/17/2018   Acute appendicitis, uncomplicated    Supervision of normal subsequent pregnancy 10/17/2013   History of cesarean section 10/17/2013   Nausea and vomiting in pregnancy prior to [redacted] weeks gestation 10/17/2013    Past Surgical History:  Procedure Laterality Date   APPENDECTOMY     CESAREAN SECTION     LAPAROSCOPIC APPENDECTOMY N/A 03/17/2018   Procedure: APPENDECTOMY LAPAROSCOPIC;  Surgeon: Florene Glen, MD;  Location: [facility] ORS;  Service: General;  Laterality: N/A;    OB History     Gravida  7   Para  1   Term  1   Preterm      AB  4   Living  1      SAB      IAB  4   Ectopic      Multiple      Live Births  1            Home Medications    Prior to Admission medications   Medication Sig Start Date End Date Taking? Authorizing Provider  levonorgestrel (MIRENA) 20 MCG/DAY IUD 1  each by Intrauterine route once.   Yes [provider]  Prenatal Vit-Fe Fumarate-FA (MULTIVITAMIN-PRENATAL) 27-0.8 MG TABS tablet Take 1 tablet by mouth daily at 12 noon.    [provider]  metoCLOPramide (REGLAN) 10 MG tablet Take 1 tablet (10 mg total) by mouth every 6 (six) hours as needed for nausea. 03/25/19 03/06/20  Harvest Dark, MD    Family History Family History  Problem Relation Age of Onset   Healthy Mother    Healthy Father     Social History Social History   Tobacco Use   Smoking status: Never   Smokeless tobacco: Never  Vaping Use   Vaping Use: Never used  Substance Use Topics   Alcohol use: Not Currently    Comment: occasionally   Drug use: No     Allergies   Penicillins and Amoxicillin   Review of Systems Review of Systems   Physical Exam Triage Vital Signs ED Triage Vitals  Enc Vitals Group     BP 05/06/22 1839 (!) 145/102     Pulse Rate 05/06/22 1839 76     Resp 05/06/22 1839 16     Temp 05/06/22 1839 98.6 F (37 C)     Temp Source 05/06/22 1839  Oral     SpO2 05/06/22 1839 99 %     Weight 05/06/22 1835 173 lb (78.5 kg)     Height 05/06/22 1835 5\' 4"  (1.626 m)     Head Circumference --      Peak Flow --      Pain Score 05/06/22 1835 8     Pain Loc --      Pain Edu? --      Excl. in [place]? --    No data found.  Updated Vital Signs BP (!) 145/102 (BP Location: Left Arm)   Pulse 76   Temp 98.6 F (37 C) (Oral)   Resp 16   Ht 5\' 4"  (1.626 m)   Wt 173 lb (78.5 kg)   LMP 05/02/2022   SpO2 99%   BMI 29.70 kg/m   Visual Acuity Right Eye Distance:   Left Eye Distance:   Bilateral Distance:    Right Eye Near:   Left Eye Near:    Bilateral Near:     Physical Exam Constitutional:      Appearance: Normal appearance.  Eyes:     Extraocular Movements: Extraocular movements intact.  Pulmonary:     Effort: Pulmonary effort is normal.  Genitourinary:    Exam position: Lithotomy position.     Comments: Moderate  amount of blood present within the vaginal canal and covering cervix, unable to visualize IUD strings, no discharge or odor present, no abnormalities externally Neurological:     General: No focal deficit present.     Mental Status: She is alert and oriented to person, place, and time. Mental status is at baseline.     Cranial Nerves: No cranial nerve deficit.     Motor: No weakness.      UC Treatments / Results  Labs (all labs ordered are listed, but only abnormal results are displayed) Labs Reviewed - No data to display  EKG   Radiology No results found.  Procedures Procedures (including critical care time)  Medications Ordered in UC Medications - No data to display  Initial Impression / Assessment and Plan / UC Course  I have reviewed the triage vital signs and the nursing notes.  Pertinent labs & imaging results that were available during my care of the patient were reviewed by me and considered in my medical decision making (see chart for details).  Bad headache  Vital signs are stable, well blood pressures slightly elevated 145/102 patient is currently experiencing pain and this is not an accurate measurement for determination of hypertension, discussed with patient, no signs of hypertensive urgency, no abnormalities neurologically, headaches and vaginal bleeding are most likely result of hormonal changes due to IUD, discussed with patient expectations for resolution of symptoms and that definitive resolution of symptoms would involve removal of IUD, discussed additional options for contraception, on speculum exam unable to visualize IUD strings, discussed she will need follow-up with gynecology for further evaluation of placement, may continue use of over-the-counter analgesics as needed for headaches, advised adequate rest, increase fluid intake and rest when headaches are present, advised to monitor blood pressure when she is without pain in a calm quiet environment for  accurate measurements and to report, blood pressure greater than 140/90 then she is to follow-up for reevaluation, discussed need for primary care doctor Final Clinical Impressions(s) / UC Diagnoses   Final diagnoses:  Bad headache     Discharge Instructions      Your headache and vaginal bleeding is most likely result of  your IUD as the hormones are still imbalance, typically can take 6 months to 1 year for this to regulate however if symptoms become unmanageable you can be removed at any point and attempt a different method of birth control  On evaluation I was unable to visualize your IUD strings, while I do believe it is still in place please reach out to your gynecologist for reevaluation of placement, this may also be done at any of the local health departments  For your headaches you may take Tylenol 500 to 1000 mg every 6 hours and/or ibuprofen 600 to 800 mg every 6-8 hours for management of pain, may take together or alternate  While headaches are present practice low stimulation activities with avoidance of loud noises and bright lights as this may cause further irritation  Ensure that you are getting adequate rest and good fluid intake through use of water as dehydration and fatigue will worsen your symptoms  If you ever begin to experience the worst headache of your life please go to the nearest emergency department for evaluation  For your blood pressure accurate blood pressures can only be measured when you were in a calm quiet environment and at a healthy state such as without pain, he may monitor over the next few weeks , taking 1-2 times, if elevated greater than 140/90 you may return for reevaluation of high blood pressure however you will need to establish care with a primary doctor for management and treatment  Follow-up with his urgent care as needed   ED Prescriptions   None    PDMP not reviewed this encounter.   Valinda Hoar, NP 05/06/22 1916     Valinda Hoar, NP 05/06/22 1916

## 2022-05-06 NOTE — Discharge Instructions (Addendum)
Your headache and vaginal bleeding is most likely result of your IUD as the hormones are still imbalance, typically can take 6 months to 1 year for this to regulate however if symptoms become unmanageable you can be removed at any point and attempt a different method of birth control  On evaluation I was unable to visualize your IUD strings, while I do believe it is still in place please reach out to your gynecologist for reevaluation of placement, this may also be done at any of the local health departments  For your headaches you may take Tylenol 500 to 1000 mg every 6 hours and/or ibuprofen 600 to 800 mg every 6-8 hours for management of pain, may take together or alternate  While headaches are present practice low stimulation activities with avoidance of loud noises and bright lights as this may cause further irritation  Ensure that you are getting adequate rest and good fluid intake through use of water as dehydration and fatigue will worsen your symptoms  If you ever begin to experience the worst headache of your life please go to the nearest emergency department for evaluation  For your blood pressure accurate blood pressures can only be measured when you were in a calm quiet environment and at a healthy state such as without pain, he may monitor over the next few weeks , taking 1-2 times, if elevated greater than 140/90 you may return for reevaluation of high blood pressure however you will need to establish care with a primary doctor for management and treatment  Follow-up with his urgent care as needed

## 2022-05-06 NOTE — ED Triage Notes (Signed)
Pt reports she is been having head pressure after she had a cold a couple weeks ago. She also reports having IUD placed in March and has been having headaches ever since. Would like her blood pressure checked. Needs a work note.

## 2022-05-13 DIAGNOSIS — R69 Illness, unspecified: Secondary | ICD-10-CM | POA: Diagnosis not present

## 2022-05-13 DIAGNOSIS — N920 Excessive and frequent menstruation with regular cycle: Secondary | ICD-10-CM | POA: Diagnosis not present

## 2022-05-13 DIAGNOSIS — R300 Dysuria: Secondary | ICD-10-CM | POA: Diagnosis not present

## 2022-05-13 DIAGNOSIS — B3731 Acute candidiasis of vulva and vagina: Secondary | ICD-10-CM | POA: Diagnosis not present

## 2022-06-08 ENCOUNTER — Ambulatory Visit
Admission: EM | Admit: 2022-06-08 | Discharge: 2022-06-08 | Disposition: A | Discharge status: Home / Self Care | Payer: 59 | Attending: Family Medicine | Admitting: Family Medicine

## 2022-06-08 DIAGNOSIS — R030 Elevated blood-pressure reading, without diagnosis of hypertension: Secondary | ICD-10-CM

## 2022-06-08 DIAGNOSIS — M2669 Other specified disorders of temporomandibular joint: Secondary | ICD-10-CM | POA: Diagnosis not present

## 2022-06-08 MED ORDER — IBUPROFEN 600 MG PO TABS: 600.0000 mg | ORAL_TABLET | Freq: Four times a day (QID) | ORAL | 0 refills | Status: DC | PRN

## 2022-06-08 NOTE — Discharge Instructions (Addendum)
Stop by the pharmacy to pick up your prescriptions.  Follow up with your primary care provider as needed.  If pain does not improve with Ibuprofen 2-3 times a day for the next week, follow up with a local dentist for further evaluation.    See handout on TMJ (temporomandibular joint inflammation)

## 2022-06-08 NOTE — ED Triage Notes (Signed)
Pt c/o bilateral ear pain but it is sharp pain in the left ear x93month.  Pt believes she has an ear infection.  Pt states that she feels the pain along the side of her face.  Pt has taken ibuprofen for pain and last dose was this morning at 7am.

## 2022-06-08 NOTE — ED Provider Notes (Signed)
[facility]    CSN: [id number] Arrival date & time: 06/08/22  1928      History   Chief Complaint Chief Complaint  Patient presents with   Ear Pain         HPI Jacklyne Baik is a 36 y.o. female.   HPI   Trust presents for left ear pain for the past couple of days. Has sinus pressure. Took ibuprofen for headache which helped. Pain described as sharp and shooting for past month.  She is a Public relations account executive.  She has not had any fever, vomiting, diarrhea, rhinorrhea or vaginal rash.  Reports she has been having headaches.  No known family history of autoimmune disorder.  She does not think she has been grinding her teeth.  She has no pain with chewing.       Past Medical History:  Diagnosis Date   Herpes    last outbreak, last year   Vaginal Pap smear, abnormal    Warts, genital     Patient Active Problem List   Diagnosis Date Noted   Acute appendicitis 03/17/2018   Acute appendicitis, uncomplicated    Supervision of normal subsequent pregnancy 10/17/2013   History of cesarean section 10/17/2013   Nausea and vomiting in pregnancy prior to [redacted] weeks gestation 10/17/2013    Past Surgical History:  Procedure Laterality Date   APPENDECTOMY     CESAREAN SECTION     LAPAROSCOPIC APPENDECTOMY N/A 03/17/2018   Procedure: APPENDECTOMY LAPAROSCOPIC;  Surgeon: Lattie Haw, MD;  Location: [facility] OR[facility];  Service: General;  Laterality: N/A;    OB History     Gravida  7   Para  1   Term  1   Preterm      AB  4   Living  1      SAB      IAB  4   Ectopic      Multiple      Live Births  1            Home Medications    Prior to Admission medications   Medication Sig Start Date End Date Taking? Authorizing Provider  ibuprofen (ADVIL) 600 MG tablet Take 1 tablet (600 mg total) by mouth every 6 (six) hours as needed. 06/08/22  Yes Dacy Enrico, Seward Meth, DO  levonorgestrel (MIRENA) 20 MCG/DAY IUD 1 each by Intrauterine route once.    Yes [provider]  Prenatal Vit-Fe Fumarate-FA (MULTIVITAMIN-PRENATAL) 27-0.8 MG TABS tablet Take 1 tablet by mouth daily at 12 noon.   Yes [provider]  valACYclovir (VALTREX) 500 MG tablet Take 1 tablet twice a day by oral route for 3 days.    [provider]  metoCLOPramide (REGLAN) 10 MG tablet Take 1 tablet (10 mg total) by mouth every 6 (six) hours as needed for nausea. 03/25/19 03/06/20  Minna Antis, MD    Family History Family History  Problem Relation Age of Onset   Healthy Mother    Healthy Father     Social History Social History   Tobacco Use   Smoking status: Never   Smokeless tobacco: Never  Vaping Use   Vaping Use: Never used  Substance Use Topics   Alcohol use: Yes    Comment: occasionally   Drug use: No     Allergies   Penicillins and Amoxicillin   Review of Systems Review of Systems: negative unless otherwise stated in HPI.      Physical Exam Triage Vital Signs ED  Triage Vitals  Enc Vitals Group     BP 06/08/22 1948 (!) 139/102     Pulse Rate 06/08/22 1948 98     Resp 06/08/22 1948 18     Temp 06/08/22 1948 98.7 F (37.1 C)     Temp Source 06/08/22 1948 Oral     SpO2 06/08/22 1948 97 %     Weight 06/08/22 1946 187 lb (84.8 kg)     Height 06/08/22 1946 5\' 4"  (1.626 m)     Head Circumference --      Peak Flow --      Pain Score 06/08/22 1946 8     Pain Loc --      Pain Edu? --      Excl. in GC? --    No data found.  Updated Vital Signs BP (!) 139/102 (BP Location: Left Arm)   Pulse 98   Temp 98.7 F (37.1 C) (Oral)   Resp 18   Ht 5\' 4"  (1.626 m)   Wt 84.8 kg   LMP 06/04/2022   SpO2 97%   BMI 32.10 kg/m   Visual Acuity Right Eye Distance:   Left Eye Distance:   Bilateral Distance:    Right Eye Near:   Left Eye Near:    Bilateral Near:     Physical Exam GEN:     alert, non-toxic appearing female in no distress    HENT:  mucus membranes moist, oropharyngeal without erythema, lesions  or exudate, no tonsillar hypertrophy no nasal discharge, bilateral TM normal, tenderness at bilateral at the tempomandibular joint EYES:   pupils equal and reactive, no scleral injection or discharge NECK:  normal ROM, no lymphadenopathy, no meningismus   RESP:  no increased work of breathing, clear to auscultation bilaterally CVS:   regular rate and rhythm Skin:   warm and dry    UC Treatments / Results  Labs (all labs ordered are listed, but only abnormal results are displayed) Labs Reviewed - No data to display  EKG   Radiology No results found.  Procedures Procedures (including critical care time)  Medications Ordered in UC Medications - No data to display  Initial Impression / Assessment and Plan / UC Course  I have reviewed the triage vital signs and the nursing notes.  Pertinent labs & imaging results that were available during my care of the patient were reviewed by me and considered in my medical decision making (see chart for details).       Pt is a 36 y.o. female who presents for 1 month of intermittent bilateral jaw pain and recent onset of left ear pain.    Ariyan is afebrile here without recent antipyretics. Satting well on room air. She is hypertensive. Overall pt is non-toxic appearing, well hydrated, without respiratory distress.  On exam, she has evidence of temporomandibular inflammation.  I suspect she may have TMJ.  Treat with ibuprofen 3 times daily for the next 7 days.  She is to follow-up with her dentist if this does not resolve.    Explained lack of efficacy of antibiotics as she does not have an ear infection.  Typical duration of symptoms discussed.   She had elevated blood pressures in the past. Has family history of hypertension (mom and dad). Discussed lifestyle changes and stress management. She will try to implement some to the suggests discussed.   Return and ED precautions given and voiced understanding. Discussed MDM, treatment plan and  plan for follow-up with patient  who  agrees with plan.     Final Clinical Impressions(s) / UC Diagnoses   Final diagnoses:  TMJ inflammation  Elevated blood pressure reading without diagnosis of hypertension     Discharge Instructions      Stop by the pharmacy to pick up your prescriptions.  Follow up with your primary care provider as needed.  If pain does not improve with Ibuprofen 2-3 times a day for the next week, follow up with a local dentist for further evaluation.    See handout on TMJ (temporomandibular joint inflammation)      ED Prescriptions     Medication Sig Dispense Auth. Provider   ibuprofen (ADVIL) 600 MG tablet Take 1 tablet (600 mg total) by mouth every 6 (six) hours as needed. 30 tablet Katha Cabal, DO      PDMP not reviewed this encounter.   Katha Cabal, DO 06/08/22 2034

## 2022-09-06 ENCOUNTER — Ambulatory Visit
Admission: EM | Admit: 2022-09-06 | Discharge: 2022-09-06 | Disposition: A | Discharge status: Home / Self Care | Payer: BC Managed Care – PPO | Attending: Emergency Medicine | Admitting: Emergency Medicine

## 2022-09-06 DIAGNOSIS — Z202 Contact with and (suspected) exposure to infections with a predominantly sexual mode of transmission: Secondary | ICD-10-CM | POA: Insufficient documentation

## 2022-09-06 MED ORDER — DOXYCYCLINE HYCLATE 100 MG PO CAPS: 100.0000 mg | ORAL_CAPSULE | Freq: Two times a day (BID) | ORAL | 0 refills | Status: AC

## 2022-09-06 NOTE — ED Triage Notes (Signed)
Pt presents to UC for STD testing, denies any active sx's. Husband tested + for chlamydia.

## 2022-09-06 NOTE — Discharge Instructions (Addendum)
Take the doxycycline twice daily with food for 7 days for treatment of your chlamydial exposure.  We are going to send off your vaginal and oral swabs for analysis to look for gonorrhea, chlamydia, and trichomonas.  If your test results come back positive you will be contacted by phone but if they are negative you will receive new lab results in your MyChart.  If you test negative you can stop taking the doxycycline.

## 2022-09-06 NOTE — ED Provider Notes (Signed)
[facility]    CSN: [id number] Arrival date & time: 09/06/22  1605      History   Chief Complaint Chief Complaint  Patient presents with   SEXUALLY TRANSMITTED DISEASE    HPI Claire Curtis is a 37 y.o. female.   HPI  37 year old female here requesting STI testing.  The patient reports that she was informed by her husband that he recently tested positive for chlamydia.  She states that she is not having any vaginal discharge, burning with urination, or urinary urgency or frequency.  She recently had an IUD placed so she still having her postplacement bleeding.  She states that it has been sometime since she and her husband have had vaginal intercourse but they do have oral intercourse and she is requesting an oral swab as well as a vaginal swab.  Past Medical History:  Diagnosis Date   Herpes    last outbreak, last year   Vaginal Pap smear, abnormal    Warts, genital     Patient Active Problem List   Diagnosis Date Noted   Acute appendicitis 03/17/2018   Acute appendicitis, uncomplicated    Supervision of normal subsequent pregnancy 10/17/2013   History of cesarean section 10/17/2013   Nausea and vomiting in pregnancy prior to [redacted] weeks gestation 10/17/2013    Past Surgical History:  Procedure Laterality Date   APPENDECTOMY     CESAREAN SECTION     LAPAROSCOPIC APPENDECTOMY N/A 03/17/2018   Procedure: APPENDECTOMY LAPAROSCOPIC;  Surgeon: Florene Glen, MD;  Location: [facility] ORS;  Service: General;  Laterality: N/A;    OB History     Gravida  7   Para  1   Term  1   Preterm      AB  4   Living  1      SAB      IAB  4   Ectopic      Multiple      Live Births  1            Home Medications    Prior to Admission medications   Medication Sig Start Date End Date Taking? Authorizing Provider  doxycycline (VIBRAMYCIN) 100 MG capsule Take 1 capsule (100 mg total) by mouth 2 (two) times daily for 7 days. 09/06/22 09/13/22 Yes  Margarette Canada, NP  ibuprofen (ADVIL) 600 MG tablet Take 1 tablet (600 mg total) by mouth every 6 (six) hours as needed. 06/08/22  Yes Brimage, Ronnette Juniper, DO  levonorgestrel (MIRENA) 20 MCG/DAY IUD 1 each by Intrauterine route once.   Yes [provider]  Prenatal Vit-Fe Fumarate-FA (MULTIVITAMIN-PRENATAL) 27-0.8 MG TABS tablet Take 1 tablet by mouth daily at 12 noon.   Yes [provider]  valACYclovir (VALTREX) 500 MG tablet Take 1 tablet twice a day by oral route for 3 days.   Yes [provider]  metoCLOPramide (REGLAN) 10 MG tablet Take 1 tablet (10 mg total) by mouth every 6 (six) hours as needed for nausea. 03/25/19 03/06/20  Harvest Dark, MD    Family History Family History  Problem Relation Age of Onset   Healthy Mother    Healthy Father     Social History Social History   Tobacco Use   Smoking status: Never   Smokeless tobacco: Never  Vaping Use   Vaping Use: Never used  Substance Use Topics   Alcohol use: Yes    Comment: occasionally   Drug use: No     Allergies  Penicillins and Amoxicillin   Review of Systems Review of Systems  Constitutional:  Negative for fever.  Genitourinary:  Positive for vaginal bleeding. Negative for dysuria, frequency, urgency, vaginal discharge and vaginal pain.     Physical Exam Triage Vital Signs ED Triage Vitals  Enc Vitals Group     BP 09/06/22 1634 139/83     Pulse Rate 09/06/22 1634 85     Resp 09/06/22 1634 16     Temp 09/06/22 1634 98.7 F (37.1 C)     Temp Source 09/06/22 1634 Oral     SpO2 09/06/22 1634 98 %     Weight 09/06/22 1633 174 lb (78.9 kg)     Height 09/06/22 1633 '5\' 4"'$  (1.626 m)     Head Circumference --      Peak Flow --      Pain Score 09/06/22 1633 0     Pain Loc --      Pain Edu? --      Excl. in [place]? --    No data found.  Updated Vital Signs BP 139/83 (BP Location: Left Arm)   Pulse 85   Temp 98.7 F (37.1 C) (Oral)   Resp 16   Ht '5\' 4"'$  (1.626 m)   Wt 174 lb  (78.9 kg)   SpO2 98%   BMI 29.87 kg/m   Visual Acuity Right Eye Distance:   Left Eye Distance:   Bilateral Distance:    Right Eye Near:   Left Eye Near:    Bilateral Near:     Physical Exam Vitals and nursing note reviewed.  Constitutional:      Appearance: Normal appearance. She is not ill-appearing.  HENT:     Mouth/Throat:     Mouth: Mucous membranes are moist.     Pharynx: Oropharynx is clear. No oropharyngeal exudate or posterior oropharyngeal erythema.  Cardiovascular:     Rate and Rhythm: Normal rate and regular rhythm.     Pulses: Normal pulses.     Heart sounds: Normal heart sounds. No murmur heard.    No friction rub. No gallop.  Pulmonary:     Effort: Pulmonary effort is normal.     Breath sounds: Normal breath sounds. No wheezing, rhonchi or rales.  Skin:    General: Skin is warm and dry.     Capillary Refill: Capillary refill takes less than 2 seconds.     Findings: No erythema or rash.  Neurological:     General: No focal deficit present.     Mental Status: She is alert and oriented to person, place, and time.      UC Treatments / Results  Labs (all labs ordered are listed, but only abnormal results are displayed) Labs Reviewed  CERVICOVAGINAL ANCILLARY ONLY  CYTOLOGY, (ORAL, ANAL, URETHRAL) ANCILLARY ONLY    EKG   Radiology No results found.  Procedures Procedures (including critical care time)  Medications Ordered in UC Medications - No data to display  Initial Impression / Assessment and Plan / UC Course  I have reviewed the triage vital signs and the nursing notes.  Pertinent labs & imaging results that were available during my care of the patient were reviewed by me and considered in my medical decision making (see chart for details).   Patient is a very pleasant, nontoxic-appearing 37 year old female here requesting STI testing orally and vaginally for chlamydia after she was informed by her husband that he tested positive.  I have  collected an oral swab and patient  subclavicular vaginal swab and I will send them both for cytology.  I will treat her empirically with doxycycline 100 mg twice daily for 7 days.  I have advised her not to have unprotected sex her husband until after they have both completed treatment.  Work note provided.   Final Clinical Impressions(s) / UC Diagnoses   Final diagnoses:  Exposure to chlamydia     Discharge Instructions      Take the doxycycline twice daily with food for 7 days for treatment of your chlamydial exposure.  We are going to send off your vaginal and oral swabs for analysis to look for gonorrhea, chlamydia, and trichomonas.  If your test results come back positive you will be contacted by phone but if they are negative you will receive new lab results in your MyChart.  If you test negative you can stop taking the doxycycline.     ED Prescriptions     Medication Sig Dispense Auth. Provider   doxycycline (VIBRAMYCIN) 100 MG capsule Take 1 capsule (100 mg total) by mouth 2 (two) times daily for 7 days. 14 capsule Margarette Canada, NP      PDMP not reviewed this encounter.   Margarette Canada, NP 09/06/22 [phone]

## 2022-09-07 LAB — CERVICOVAGINAL ANCILLARY ONLY
Chlamydia: NEGATIVE
Comment: NEGATIVE
Comment: NEGATIVE
Comment: NORMAL
Neisseria Gonorrhea: NEGATIVE
Trichomonas: NEGATIVE

## 2022-09-07 LAB — CYTOLOGY, (ORAL, ANAL, URETHRAL) ANCILLARY ONLY
Chlamydia: NEGATIVE
Comment: NEGATIVE
Comment: NEGATIVE
Comment: NORMAL
Neisseria Gonorrhea: NEGATIVE
Trichomonas: NEGATIVE

## 2023-02-19 ENCOUNTER — Ambulatory Visit
Admission: EM | Admit: 2023-02-19 | Discharge: 2023-02-19 | Disposition: A | Discharge status: Home / Self Care | Payer: BC Managed Care – PPO | Source: Home / Self Care

## 2023-02-19 DIAGNOSIS — B9689 Other specified bacterial agents as the cause of diseases classified elsewhere: Secondary | ICD-10-CM | POA: Diagnosis present

## 2023-02-19 DIAGNOSIS — Z113 Encounter for screening for infections with a predominantly sexual mode of transmission: Secondary | ICD-10-CM | POA: Insufficient documentation

## 2023-02-19 DIAGNOSIS — N760 Acute vaginitis: Secondary | ICD-10-CM | POA: Diagnosis present

## 2023-02-19 DIAGNOSIS — R1031 Right lower quadrant pain: Secondary | ICD-10-CM | POA: Insufficient documentation

## 2023-02-19 LAB — URINALYSIS, W/ REFLEX TO CULTURE (INFECTION SUSPECTED)
Bilirubin Urine: NEGATIVE
Glucose, UA: NEGATIVE mg/dL
Ketones, ur: NEGATIVE mg/dL
Leukocytes,Ua: NEGATIVE
Nitrite: NEGATIVE
Protein, ur: NEGATIVE mg/dL
Specific Gravity, Urine: 1.025 (ref 1.005–1.030)
pH: 6.5 (ref 5.0–8.0)

## 2023-02-19 LAB — WET PREP, GENITAL
Sperm: NONE SEEN
Trich, Wet Prep: NONE SEEN
WBC, Wet Prep HPF POC: >10 — AB (ref <10)
Yeast Wet Prep HPF POC: NONE SEEN

## 2023-02-19 LAB — HIV ANTIBODY (ROUTINE TESTING W REFLEX): HIV Screen 4th Generation wRfx: NONREACTIVE

## 2023-02-19 MED ORDER — METRONIDAZOLE 500 MG PO TABS: 500.0000 mg | ORAL_TABLET | Freq: Two times a day (BID) | ORAL | 0 refills | Status: DC

## 2023-02-19 NOTE — ED Provider Notes (Signed)
[facility]    CSN: [id number] Arrival date & time: 02/19/23  0848      History   Chief Complaint Chief Complaint  Patient presents with   Abdominal Pain    HPI Lillyonna Mautner is a 37 y.o. female.   37 year old female who presents to urgent care with right lower abdominal pain with urinary frequency and vaginal stinging and burning that started around August 14.  Last night she got significant pressure with urinating as well.  She denies fevers or chills, nausea or vomiting, itching or discharge from the vaginal area.  She did contact her GYN who recommended a boric supplement which she did yesterday and she took fluconazole which did not help.  She does relate a history of having some suprapubic right lower abdominal pain and very heavy menses.   Abdominal Pain   Past Medical History:  Diagnosis Date   Herpes    last outbreak, last year   Vaginal Pap smear, abnormal    Warts, genital     Patient Active Problem List   Diagnosis Date Noted   Acute appendicitis 03/17/2018   Acute appendicitis, uncomplicated    Supervision of normal subsequent pregnancy 10/17/2013   History of cesarean section 10/17/2013   Nausea and vomiting in pregnancy prior to [redacted] weeks gestation 10/17/2013    Past Surgical History:  Procedure Laterality Date   APPENDECTOMY     CESAREAN SECTION     LAPAROSCOPIC APPENDECTOMY N/A 03/17/2018   Procedure: APPENDECTOMY LAPAROSCOPIC;  Surgeon: Lattie Haw, MD;  Location: [facility] ORS;  Service: General;  Laterality: N/A;    OB History     Gravida  7   Para  1   Term  1   Preterm      AB  4   Living  1      SAB      IAB  4   Ectopic      Multiple      Live Births  1            Home Medications    Prior to Admission medications   Medication Sig Start Date End Date Taking? Authorizing Provider  ibuprofen (ADVIL) 600 MG tablet Take 1 tablet (600 mg total) by mouth every 6 (six) hours as needed. 06/08/22   Yes Brimage, Seward Meth, DO  levonorgestrel (MIRENA) 20 MCG/DAY IUD 1 each by Intrauterine route once.   Yes [provider]  Prenatal Vit-Fe Fumarate-FA (MULTIVITAMIN-PRENATAL) 27-0.8 MG TABS tablet Take 1 tablet by mouth daily at 12 noon.   Yes [provider]  valACYclovir (VALTREX) 500 MG tablet Take 1 tablet twice a day by oral route for 3 days.   Yes [provider]  metoCLOPramide (REGLAN) 10 MG tablet Take 1 tablet (10 mg total) by mouth every 6 (six) hours as needed for nausea. 03/25/19 03/06/20  Minna Antis, MD    Family History Family History  Problem Relation Age of Onset   Healthy Mother    Healthy Father     Social History Social History   Tobacco Use   Smoking status: Never   Smokeless tobacco: Never  Vaping Use   Vaping status: Never Used  Substance Use Topics   Alcohol use: Yes    Comment: occasionally   Drug use: No     Allergies   Penicillins and Amoxicillin   Review of Systems Review of Systems  Gastrointestinal:  Positive for abdominal pain.     Physical Exam Triage  Vital Signs ED Triage Vitals  Encounter Vitals Group     BP 02/19/23 1005 (!) 135/92     Systolic BP Percentile --      Diastolic BP Percentile --      Pulse Rate 02/19/23 1005 89     Resp --      Temp 02/19/23 1005 99.1 F (37.3 C)     Temp Source 02/19/23 1005 Oral     SpO2 02/19/23 1005 97 %     Weight 02/19/23 1004 167 lb (75.8 kg)     Height 02/19/23 1004 5\' 4"  (1.626 m)     Head Circumference --      Peak Flow --      Pain Score 02/19/23 1004 8     Pain Loc --      Pain Education --      Exclude from Growth Chart --    No data found.  Updated Vital Signs BP (!) 135/92 (BP Location: Left Arm)   Pulse 89   Temp 99.1 F (37.3 C) (Oral)   Ht 5\' 4"  (1.626 m)   Wt 167 lb (75.8 kg)   LMP 02/12/2023   SpO2 97%   BMI 28.67 kg/m   Visual Acuity Right Eye Distance:   Left Eye Distance:   Bilateral Distance:    Right Eye Near:   Left  Eye Near:    Bilateral Near:     Physical Exam   UC Treatments / Results  Labs (all labs ordered are listed, but only abnormal results are displayed) Labs Reviewed  WET PREP, GENITAL  URINALYSIS, W/ REFLEX TO CULTURE (INFECTION SUSPECTED)    EKG   Radiology No results found.  Procedures Procedures (including critical care time)  Medications Ordered in UC Medications - No data to display  Initial Impression / Assessment and Plan / UC Course  I have reviewed the triage vital signs and the nursing notes.  Pertinent labs & imaging results that were available during my care of the patient were reviewed by me and considered in my medical decision making (see chart for details).     Bacterial vaginitis: Will start patient on metronidazole twice daily x 1 week.  For the recurrent suprapubic/right lower abdominal pain with heavy menses I recommend following up with GYN for further evaluation.  Patient also request STD screening along with HIV screening which is done today.  These results will be available on MyChart and we will contact the patient if any of these are positive.  Follow-up with urgent care if symptoms fail to resolve or worsen. Final Clinical Impressions(s) / UC Diagnoses   Final diagnoses:  None   Discharge Instructions   None    ED Prescriptions   None    PDMP not reviewed this encounter.   Landis Martins, [place] 02/19/23 1154

## 2023-02-19 NOTE — ED Triage Notes (Signed)
Pt c/o lower right abdominal pain and pain when urinating x3days  Pt states that she has vaginal burning and pressure  Pt denies any vaginal odor but states that she has had brown/red discharge. Pt recently came off her cycle.

## 2023-02-19 NOTE — Discharge Instructions (Signed)
Start metronidazole twice daily for 1 week.  Take this with food.  Follow-up with GYN for heavy menses and recurrent lower abdominal pain.  Return to urgent care if symptoms fail to improve or worsen.

## 2023-02-19 NOTE — ED Triage Notes (Signed)
Pt took 1 dose of Fluconazole on 02/16/23 and a boric acid suppository 02/18/23

## 2023-02-20 LAB — RPR: RPR Ser Ql: NONREACTIVE

## 2023-02-21 LAB — CERVICOVAGINAL ANCILLARY ONLY
Chlamydia: NEGATIVE
Comment: NEGATIVE
Comment: NORMAL
Neisseria Gonorrhea: NEGATIVE

## 2023-08-17 ENCOUNTER — Encounter: Payer: Self-pay | Admitting: Emergency Medicine

## 2023-08-17 ENCOUNTER — Ambulatory Visit
Admission: EM | Admit: 2023-08-17 | Discharge: 2023-08-17 | Disposition: A | Discharge status: Home / Self Care | Payer: 59 | Attending: Emergency Medicine | Admitting: Emergency Medicine

## 2023-08-17 DIAGNOSIS — B9689 Other specified bacterial agents as the cause of diseases classified elsewhere: Secondary | ICD-10-CM | POA: Diagnosis present

## 2023-08-17 DIAGNOSIS — N760 Acute vaginitis: Secondary | ICD-10-CM | POA: Insufficient documentation

## 2023-08-17 DIAGNOSIS — N390 Urinary tract infection, site not specified: Secondary | ICD-10-CM | POA: Diagnosis present

## 2023-08-17 LAB — WET PREP, GENITAL
Sperm: NONE SEEN
Trich, Wet Prep: NONE SEEN
WBC, Wet Prep HPF POC: <10 — AB (ref <10)
Yeast Wet Prep HPF POC: NONE SEEN

## 2023-08-17 LAB — URINALYSIS, W/ REFLEX TO CULTURE (INFECTION SUSPECTED)
Bilirubin Urine: NEGATIVE
Glucose, UA: NEGATIVE mg/dL
Nitrite: NEGATIVE
Protein, ur: NEGATIVE mg/dL
Specific Gravity, Urine: >1.03 — ABNORMAL HIGH (ref 1.005–1.030)
pH: 5.5 (ref 5.0–8.0)

## 2023-08-17 MED ORDER — NITROFURANTOIN MONOHYD MACRO 100 MG PO CAPS: 100.0000 mg | ORAL_CAPSULE | Freq: Two times a day (BID) | ORAL | 0 refills | Status: DC

## 2023-08-17 MED ORDER — METRONIDAZOLE 500 MG PO TABS: 500.0000 mg | ORAL_TABLET | Freq: Two times a day (BID) | ORAL | 0 refills | Status: DC

## 2023-08-17 MED ORDER — PHENAZOPYRIDINE HCL 200 MG PO TABS: 200.0000 mg | ORAL_TABLET | Freq: Three times a day (TID) | ORAL | 0 refills | Status: DC

## 2023-08-17 NOTE — ED Triage Notes (Signed)
Pt c/o urinary freq,burning & pain x2 wks. Noticed some discharge & hemturia today. Was seen by OB on 2/10, had wet prep done which was WNL. Concerned for STD.

## 2023-08-17 NOTE — Discharge Instructions (Addendum)
Take the Flagyl (metronidazole) 500 mg twice daily for treatment of your bacterial vaginosis.  Avoid alcohol while on the metronidazole as taken together will cause of vomiting.  Bacterial vaginosis is often caused by a imbalance of bacteria in your vaginal vault.  This is sometimes a result of using tampons or hormonal fluctuations during her menstrual cycle.  You if your symptoms are recurrent you can try using a boric acid suppository twice weekly to help maintain the acid-base balance in your vagina vault which could prevent further infection.  You can also try vaginal probiotics to help return normal bacterial balance.   Take the Macrobid twice daily for 5 days with food for treatment of urinary tract infection.  Use the Pyridium every 8 hours as needed for urinary discomfort.  This will turn your urine a bright red-orange.  Increase your oral fluid intake so that you increase your urine production and or flushing your urinary system.  Take an over-the-counter probiotic, such as Culturelle-Align-Activia, 1 hour after each dose of antibiotic to prevent diarrhea or yeast infections from forming.  We will culture urine and change the antibiotics if necessary.  Return for reevaluation, or see your primary care provider, for any new or worsening symptoms.

## 2023-08-17 NOTE — ED Provider Notes (Signed)
[facility]    CSN: [id number] Arrival date & time: 08/17/23  1842      History   Chief Complaint Chief Complaint  Patient presents with   Dysuria   Vaginal Discharge    HPI Claire Curtis is a 38 y.o. female.   HPI  38 year old female with past medical history significant for genital warts, and herpes presents for evaluation of urinary frequency and burning with urination that been present for the last 2 weeks.  Today she noticed some cloudiness to her urine and blood in her urine along with a white, stringy vaginal discharge.  She is also had some nausea and suprapubic abdominal pain but she denies any fever, vomiting, or back pain.  Past Medical History:  Diagnosis Date   Herpes    last outbreak, last year   Vaginal Pap smear, abnormal    Warts, genital     Patient Active Problem List   Diagnosis Date Noted   Acute appendicitis 03/17/2018   Acute appendicitis, uncomplicated    Encounter for supervision of normal pregnancy in multigravida 10/17/2013   History of cesarean section 10/17/2013   Nausea and vomiting in pregnancy prior to [redacted] weeks gestation 10/17/2013    Past Surgical History:  Procedure Laterality Date   APPENDECTOMY     CESAREAN SECTION     LAPAROSCOPIC APPENDECTOMY N/A 03/17/2018   Procedure: APPENDECTOMY LAPAROSCOPIC;  Surgeon: Lattie Haw, MD;  Location: [facility] ORS;  Service: General;  Laterality: N/A;    OB History     Gravida  7   Para  1   Term  1   Preterm      AB  4   Living  1      SAB      IAB  4   Ectopic      Multiple      Live Births  1            Home Medications    Prior to Admission medications   Medication Sig Start Date End Date Taking? Authorizing Provider  hydrochlorothiazide (MICROZIDE) 12.5 MG capsule Take 12.5 mg by mouth every morning. 08/15/23  Yes [provider]  metroNIDAZOLE (FLAGYL) 500 MG tablet Take 1 tablet (500 mg total) by mouth 2 (two) times daily.  08/17/23  Yes Becky Augusta, NP  nitrofurantoin, macrocrystal-monohydrate, (MACROBID) 100 MG capsule Take 1 capsule (100 mg total) by mouth 2 (two) times daily. 08/17/23  Yes Becky Augusta, NP  phenazopyridine (PYRIDIUM) 200 MG tablet Take 1 tablet (200 mg total) by mouth 3 (three) times daily. 08/17/23  Yes Becky Augusta, NP  levonorgestrel (MIRENA) 20 MCG/DAY IUD 1 each by Intrauterine route once.   Yes [provider]  valACYclovir (VALTREX) 500 MG tablet Take 1 tablet twice a day by oral route for 3 days.   Yes [provider]  metoCLOPramide (REGLAN) 10 MG tablet Take 1 tablet (10 mg total) by mouth every 6 (six) hours as needed for nausea. 03/25/19 03/06/20  Minna Antis, MD    Family History Family History  Problem Relation Age of Onset   Healthy Mother    Healthy Father     Social History Social History   Tobacco Use   Smoking status: Never   Smokeless tobacco: Never  Vaping Use   Vaping status: Never Used  Substance Use Topics   Alcohol use: Yes    Comment: occasionally   Drug use: No     Allergies   Amoxicillin and  Penicillins   Review of Systems Review of Systems  Constitutional:  Negative for fever.  Gastrointestinal:  Positive for abdominal pain and nausea. Negative for vomiting.  Genitourinary:  Positive for dysuria, frequency, hematuria, urgency, vaginal discharge and vaginal pain.  Musculoskeletal:  Negative for back pain.     Physical Exam Triage Vital Signs ED Triage Vitals  Encounter Vitals Group     BP      Systolic BP Percentile      Diastolic BP Percentile      Pulse      Resp      Temp      Temp src      SpO2      Weight      Height      Head Circumference      Peak Flow      Pain Score      Pain Loc      Pain Education      Exclude from Growth Chart    No data found.  Updated Vital Signs BP 129/82 (BP Location: Left Arm)   Pulse 96   Temp 98.9 F (37.2 C) (Oral)   Resp 16   Ht 5\' 4"  (1.626 m)   Wt 165 lb  (74.8 kg)   SpO2 100%   Breastfeeding No   BMI 28.32 kg/m   Visual Acuity Right Eye Distance:   Left Eye Distance:   Bilateral Distance:    Right Eye Near:   Left Eye Near:    Bilateral Near:     Physical Exam Vitals and nursing note reviewed.  Constitutional:      Appearance: Normal appearance. She is not ill-appearing.  HENT:     Head: Normocephalic and atraumatic.  Cardiovascular:     Rate and Rhythm: Normal rate and regular rhythm.     Pulses: Normal pulses.     Heart sounds: Normal heart sounds. No murmur heard.    No friction rub. No gallop.  Pulmonary:     Effort: Pulmonary effort is normal.     Breath sounds: Normal breath sounds. No wheezing, rhonchi or rales.  Abdominal:     General: Abdomen is flat.     Palpations: Abdomen is soft.     Tenderness: There is abdominal tenderness. There is no right CVA tenderness or left CVA tenderness.     Comments: Mild suprapubic tenderness without guarding or rebound.  Skin:    General: Skin is warm and dry.     Capillary Refill: Capillary refill takes less than 2 seconds.     Findings: No rash.  Neurological:     General: No focal deficit present.     Mental Status: She is alert and oriented to person, place, and time.      UC Treatments / Results  Labs (all labs ordered are listed, but only abnormal results are displayed) Labs Reviewed  WET PREP, GENITAL - Abnormal; Notable for the following components:      Result Value   Clue Cells Wet Prep HPF POC PRESENT (*)    WBC, Wet Prep HPF POC <10 (*)    All other components within normal limits  URINALYSIS, W/ REFLEX TO CULTURE (INFECTION SUSPECTED) - Abnormal; Notable for the following components:   APPearance HAZY (*)    Specific Gravity, Urine >1.030 (*)    Hgb urine dipstick TRACE (*)    Ketones, ur TRACE (*)    Leukocytes,Ua TRACE (*)    Bacteria, UA MANY (*)  All other components within normal limits  URINE CULTURE  CERVICOVAGINAL ANCILLARY ONLY     EKG   Radiology No results found.  Procedures Procedures (including critical care time)  Medications Ordered in UC Medications - No data to display  Initial Impression / Assessment and Plan / UC Course  I have reviewed the triage vital signs and the nursing notes.  Pertinent labs & imaging results that were available during my care of the patient were reviewed by me and considered in my medical decision making (see chart for details).   Patient is a nontoxic-appearing 38 year old female presenting for evaluation of genitourinary symptoms as outlined HPI above.  The patient is followed by a Dr. Shawnie Pons in [facility], who is an independent OB/GYN.  She reports that she saw him 2 days ago and had a urinalysis and negative wet prep but she is continuing to experience burning in her vaginal area along with burning with urination and urinary frequency.  She also reports hematuria today.  Her physical exam reveals no CVA tenderness and mild suprapubic abdominal pain without guarding or rebound.  Given that I am unable to see her lab results I will repeat her wet prep as well as order urinalysis to evaluate for the presence of UTI.  The patient states she is concerned about STIs and she is unsure if Dr. Carlyon Prows collected an STI panel.  I will order a cytology swab to evaluate for the presence of gonorrhea and chlamydia.  Vaginal wet prep is positive for clue cells.  Urinalysis shows a high specific gravity of >1.030, hazy appearance, trace hemoglobin, trace ketones, and trace leukocyte esterase.  Negative for nitrites or protein.  Reflex microscopy shows skin contamination with 11-20 squamous epithelials along with 11-20 WBCs, many bacteria, and WBC clumps.  I will order a urine culture and have patient recollect.  I will discharge her home with a diagnosis of urinary tract infection and start her on Macrobid 100 mg twice daily for 5 days for her UTI along with Pyridium every 8 hours help with  urinary discomfort.  Additionally, I will start her on metronidazole 500 mg twice daily for 7 days for treatment of bacterial vaginosis.  I would not treat her empirically for any STIs at this time, rather I will wait for the test results.   Final Clinical Impressions(s) / UC Diagnoses   Final diagnoses:  BV (bacterial vaginosis)  Lower urinary tract infectious disease     Discharge Instructions      Take the Flagyl (metronidazole) 500 mg twice daily for treatment of your bacterial vaginosis.  Avoid alcohol while on the metronidazole as taken together will cause of vomiting.  Bacterial vaginosis is often caused by a imbalance of bacteria in your vaginal vault.  This is sometimes a result of using tampons or hormonal fluctuations during her menstrual cycle.  You if your symptoms are recurrent you can try using a boric acid suppository twice weekly to help maintain the acid-base balance in your vagina vault which could prevent further infection.  You can also try vaginal probiotics to help return normal bacterial balance.   Take the Macrobid twice daily for 5 days with food for treatment of urinary tract infection.  Use the Pyridium every 8 hours as needed for urinary discomfort.  This will turn your urine a bright red-orange.  Increase your oral fluid intake so that you increase your urine production and or flushing your urinary system.  Take an over-the-counter probiotic, such as  Culturelle-Align-Activia, 1 hour after each dose of antibiotic to prevent diarrhea or yeast infections from forming.  We will culture urine and change the antibiotics if necessary.  Return for reevaluation, or see your primary care provider, for any new or worsening symptoms.      ED Prescriptions     Medication Sig Dispense Auth. Provider   nitrofurantoin, macrocrystal-monohydrate, (MACROBID) 100 MG capsule Take 1 capsule (100 mg total) by mouth 2 (two) times daily. 10 capsule Becky Augusta, NP    metroNIDAZOLE (FLAGYL) 500 MG tablet Take 1 tablet (500 mg total) by mouth 2 (two) times daily. 14 tablet Becky Augusta, NP   phenazopyridine (PYRIDIUM) 200 MG tablet Take 1 tablet (200 mg total) by mouth 3 (three) times daily. 6 tablet Becky Augusta, NP      PDMP not reviewed this encounter.   Becky Augusta, NP 08/17/23 [phone]

## 2023-08-18 LAB — CERVICOVAGINAL ANCILLARY ONLY
Chlamydia: NEGATIVE
Comment: NEGATIVE
Comment: NEGATIVE
Comment: NORMAL
Neisseria Gonorrhea: NEGATIVE
Trichomonas: NEGATIVE

## 2023-08-18 LAB — URINE CULTURE
Culture: NO GROWTH
Special Requests: NORMAL

## 2023-08-19 MED ORDER — METRONIDAZOLE 0.75 % VA GEL: 1.0000 | Freq: Every day | VAGINAL | 0 refills | Status: AC

## 2023-09-13 ENCOUNTER — Ambulatory Visit

## 2023-10-26 ENCOUNTER — Ambulatory Visit
Admission: EM | Admit: 2023-10-26 | Discharge: 2023-10-26 | Disposition: A | Discharge status: Home / Self Care | Attending: Emergency Medicine | Admitting: Emergency Medicine

## 2023-10-26 ENCOUNTER — Encounter: Payer: Self-pay | Admitting: Emergency Medicine

## 2023-10-26 DIAGNOSIS — B3731 Acute candidiasis of vulva and vagina: Secondary | ICD-10-CM | POA: Diagnosis not present

## 2023-10-26 DIAGNOSIS — N898 Other specified noninflammatory disorders of vagina: Secondary | ICD-10-CM | POA: Insufficient documentation

## 2023-10-26 DIAGNOSIS — R300 Dysuria: Secondary | ICD-10-CM | POA: Diagnosis present

## 2023-10-26 LAB — URINALYSIS, W/ REFLEX TO CULTURE (INFECTION SUSPECTED)
Bilirubin Urine: NEGATIVE
Glucose, UA: NEGATIVE mg/dL
Ketones, ur: NEGATIVE mg/dL
Leukocytes,Ua: NEGATIVE
Nitrite: NEGATIVE
Protein, ur: NEGATIVE mg/dL
Specific Gravity, Urine: 1.025 (ref 1.005–1.030)
pH: 6.5 (ref 5.0–8.0)

## 2023-10-26 LAB — PREGNANCY, URINE: Preg Test, Ur: NEGATIVE

## 2023-10-26 MED ORDER — FLUCONAZOLE 150 MG PO TABS: 150.0000 mg | ORAL_TABLET | ORAL | 0 refills | Status: AC

## 2023-10-26 MED ORDER — PHENAZOPYRIDINE HCL 200 MG PO TABS: 200.0000 mg | ORAL_TABLET | Freq: Three times a day (TID) | ORAL | 0 refills | Status: DC

## 2023-10-26 NOTE — ED Triage Notes (Signed)
 Patient states that she's spotting and has a IUD. Musty smell to vagina Lower abdominal pain Wants STD test swab  U Preg  and check for UTI. Burning with urination

## 2023-10-26 NOTE — ED Provider Notes (Signed)
 [facility]    CSN: [id number] Arrival date & time: 10/26/23  1538      History   Chief Complaint Chief Complaint  Patient presents with   Abdominal Pain   Dysuria    HPI Claire Curtis  is a 38 y.o. female.   HPI  38 year old female with past medical history significant for genital warts and HSV-2 presents for evaluation of musty vaginal odor with a vaginal discharge that is been a mixture of green mucousy and white milky.  She also endorses urinary urgency, frequency, and burning with urination.  She has had some vaginal spotting but she denies any blood in her urine and she denies fever.  Patient is requesting STI testing, urinalysis, and urine pregnancy test.  She does have an IUD in place.  Past Medical History:  Diagnosis Date   Herpes    last outbreak, last year   Vaginal Pap smear, abnormal    Warts, genital     Patient Active Problem List   Diagnosis Date Noted   Acute appendicitis 03/17/2018   Acute appendicitis, uncomplicated    Encounter for supervision of normal pregnancy in multigravida 10/17/2013   History of cesarean section 10/17/2013   Nausea and vomiting in pregnancy prior to [redacted] weeks gestation 10/17/2013    Past Surgical History:  Procedure Laterality Date   APPENDECTOMY     CESAREAN SECTION     LAPAROSCOPIC APPENDECTOMY N/A 03/17/2018   Procedure: APPENDECTOMY LAPAROSCOPIC;  Surgeon: Claudia Cuff, MD;  Location: [facility] ORS;  Service: General;  Laterality: N/A;    OB History     Gravida  7   Para  1   Term  1   Preterm      AB  4   Living  1      SAB      IAB  4   Ectopic      Multiple      Live Births  1            Home Medications    Prior to Admission medications   Medication Sig Start Date End Date Taking? Authorizing Provider  fluconazole  (DIFLUCAN ) 150 MG tablet Take 1 tablet (150 mg total) by mouth every 3 (three) days for 3 doses. 10/26/23 11/02/23 Yes Kent Pear, NP   hydrochlorothiazide (MICROZIDE) 12.5 MG capsule Take 12.5 mg by mouth every morning. 08/15/23  Yes [provider]  levonorgestrel (MIRENA) 20 MCG/DAY IUD 1 each by Intrauterine route once.   Yes [provider]  phenazopyridine  (PYRIDIUM ) 200 MG tablet Take 1 tablet (200 mg total) by mouth 3 (three) times daily. 10/26/23  Yes Kent Pear, NP  metroNIDAZOLE  (FLAGYL ) 500 MG tablet Take 1 tablet (500 mg total) by mouth 2 (two) times daily. 08/17/23   Kent Pear, NP  nitrofurantoin , macrocrystal-monohydrate, (MACROBID ) 100 MG capsule Take 1 capsule (100 mg total) by mouth 2 (two) times daily. 08/17/23   Kent Pear, NP  valACYclovir  (VALTREX ) 500 MG tablet Take 1 tablet twice a day by oral route for 3 days.    [provider]  metoCLOPramide  (REGLAN ) 10 MG tablet Take 1 tablet (10 mg total) by mouth every 6 (six) hours as needed for nausea. 03/25/19 03/06/20  Ruth Cove, MD    Family History Family History  Problem Relation Age of Onset   Healthy Mother    Healthy Father     Social History Social History   Tobacco Use   Smoking status: Never   Smokeless  tobacco: Never  Vaping Use   Vaping status: Never Used  Substance Use Topics   Alcohol use: Yes    Comment: occasionally   Drug use: No     Allergies   Amoxicillin  and Penicillins   Review of Systems Review of Systems  Gastrointestinal:  Positive for abdominal pain. Negative for nausea and vomiting.  Genitourinary:  Positive for dysuria, frequency, urgency, vaginal bleeding, vaginal discharge and vaginal pain. Negative for hematuria.  Musculoskeletal:  Negative for back pain.     Physical Exam Triage Vital Signs ED Triage Vitals  Encounter Vitals Group     BP      Systolic BP Percentile      Diastolic BP Percentile      Pulse      Resp      Temp      Temp src      SpO2      Weight      Height      Head Circumference      Peak Flow      Pain Score      Pain Loc      Pain  Education      Exclude from Growth Chart    No data found.  Updated Vital Signs BP 114/73 (BP Location: Right Arm)   Pulse 83   Temp 98.9 F (37.2 C) (Oral)   Resp 14   SpO2 98%   Visual Acuity Right Eye Distance:   Left Eye Distance:   Bilateral Distance:    Right Eye Near:   Left Eye Near:    Bilateral Near:     Physical Exam Vitals and nursing note reviewed.  Constitutional:      Appearance: Normal appearance. She is not ill-appearing.  HENT:     Head: Normocephalic and atraumatic.  Cardiovascular:     Rate and Rhythm: Normal rate and regular rhythm.     Pulses: Normal pulses.     Heart sounds: Normal heart sounds. No murmur heard.    No friction rub. No gallop.  Pulmonary:     Effort: Pulmonary effort is normal.     Breath sounds: Normal breath sounds. No wheezing, rhonchi or rales.  Abdominal:     General: Abdomen is flat.     Palpations: Abdomen is soft.     Tenderness: There is abdominal tenderness. There is no right CVA tenderness, left CVA tenderness, guarding or rebound.  Skin:    General: Skin is warm and dry.     Capillary Refill: Capillary refill takes less than 2 seconds.     Findings: No rash.  Neurological:     General: No focal deficit present.     Mental Status: She is alert and oriented to person, place, and time.      UC Treatments / Results  Labs (all labs ordered are listed, but only abnormal results are displayed) Labs Reviewed  URINALYSIS, W/ REFLEX TO CULTURE (INFECTION SUSPECTED) - Abnormal; Notable for the following components:      Result Value   APPearance HAZY (*)    Hgb urine dipstick SMALL (*)    Bacteria, UA FEW (*)    All other components within normal limits  PREGNANCY, URINE  CERVICOVAGINAL ANCILLARY ONLY    EKG   Radiology No results found.  Procedures Procedures (including critical care time)  Medications Ordered in UC Medications - No data to display  Initial Impression / Assessment and Plan / UC  Course  I have  reviewed the triage vital signs and the nursing notes.  Pertinent labs & imaging results that were available during my care of the patient were reviewed by me and considered in my medical decision making (see chart for details).   Patient is a pleasant, nontoxic-appearing 38 year old female presenting for evaluation of genitourinary complaints as outlined HPI above.  Her physical exam reveals a benign cardiopulmonary exam.  No CVA tenderness present.  Abdomen is soft and flat with suprapubic tenderness.  No guarding or rebound.  I will order a urinalysis, vaginal cytology swab, and urine pregnancy test.  Patient does have an IUD in place but she is requesting the pregnancy test.  Urine pregnancy test is negative.  Urinalysis shows hazy appearance with small hemoglobin.  Negative leukocyte esterase, nitrates, protein, ketones, or glucose.  Reflex microscopy shows few bacteria and budding yeast.  I will discharge patient home with a diagnosis of discharge and vaginal yeast infection based upon the urine results.  I will not treat her empirically for the vaginal discharge but rather we will wait for the cytology swab results.  I will treat her for a vaginal yeast infection with Diflucan  150 mg tablets, 1 tablet now and repeat dosing every 3 days for total of 3 doses.  Will also prescribe Pyridium  to help with the urinary discomfort.  Vaginal cytology swab is negative for chlamydia, gonorrhea, trichomonas, BV, or Candida.   Final Clinical Impressions(s) / UC Diagnoses   Final diagnoses:  Dysuria  Vaginal yeast infection  Vaginal discharge     Discharge Instructions      As we discussed, your urinalysis did show budding yeast but no evidence of urinary tract infection.  The yeast infection could be causing your urinary symptoms.  Use the Diflucan  to treat your yeast infection.  Take 1 tablet now and repeat dosing every 3 days for total of 3 doses.  I will also prescribe  Pyridium  that you can take every 8 hours to help with the burning you experience with urination.  Your vaginal cytology swab will be back tomorrow and if you test positive for any infection you will be contacted by phone and treatment options will be provided.     ED Prescriptions     Medication Sig Dispense Auth. Provider   phenazopyridine  (PYRIDIUM ) 200 MG tablet Take 1 tablet (200 mg total) by mouth 3 (three) times daily. 6 tablet Kent Pear, NP   fluconazole  (DIFLUCAN ) 150 MG tablet Take 1 tablet (150 mg total) by mouth every 3 (three) days for 3 doses. 3 tablet Kent Pear, NP      PDMP not reviewed this encounter.   Kent Pear, NP 10/26/23 8295    Kent Pear, NP 10/27/23 1257

## 2023-10-26 NOTE — Discharge Instructions (Addendum)
 As we discussed, your urinalysis did show budding yeast but no evidence of urinary tract infection.  The yeast infection could be causing your urinary symptoms.  Use the Diflucan  to treat your yeast infection.  Take 1 tablet now and repeat dosing every 3 days for total of 3 doses.  I will also prescribe Pyridium  that you can take every 8 hours to help with the burning you experience with urination.  Your vaginal cytology swab will be back tomorrow and if you test positive for any infection you will be contacted by phone and treatment options will be provided.

## 2023-10-27 LAB — CERVICOVAGINAL ANCILLARY ONLY
Bacterial Vaginitis (gardnerella): NEGATIVE
Candida Glabrata: NEGATIVE
Candida Vaginitis: NEGATIVE
Chlamydia: NEGATIVE
Comment: NEGATIVE
Comment: NEGATIVE
Comment: NEGATIVE
Comment: NEGATIVE
Comment: NEGATIVE
Comment: NORMAL
Neisseria Gonorrhea: NEGATIVE
Trichomonas: NEGATIVE

## 2024-07-27 ENCOUNTER — Encounter: Payer: Self-pay | Admitting: Emergency Medicine

## 2024-07-27 ENCOUNTER — Ambulatory Visit
Admission: EM | Admit: 2024-07-27 | Discharge: 2024-07-27 | Disposition: A | Discharge status: Home / Self Care | Attending: Family Medicine | Admitting: Family Medicine

## 2024-07-27 DIAGNOSIS — R300 Dysuria: Secondary | ICD-10-CM | POA: Insufficient documentation

## 2024-07-27 DIAGNOSIS — R399 Unspecified symptoms and signs involving the genitourinary system: Secondary | ICD-10-CM

## 2024-07-27 LAB — POCT URINE DIPSTICK
Bilirubin, UA: NEGATIVE
Glucose, UA: NEGATIVE mg/dL
Ketones, POC UA: NEGATIVE mg/dL
Nitrite, UA: NEGATIVE
POC PROTEIN,UA: NEGATIVE
Spec Grav, UA: 1.02
Urobilinogen, UA: 1 U/dL
pH, UA: 7

## 2024-07-27 MED ORDER — PHENAZOPYRIDINE HCL 200 MG PO TABS: 200.0000 mg | ORAL_TABLET | Freq: Three times a day (TID) | ORAL | 0 refills | Status: AC

## 2024-07-27 NOTE — Discharge Instructions (Addendum)
 Your urinalysis does not show definitive proof of an infection.  We will send the urine for culture.  The cytology swab will be back in next 1 to 2 days and that we will look for the presence of BV or yeast, which can also cause UTI symptoms.  Use the Pyridium  200 mg every 8 hours as needed for urinary discomfort.  Increase your oral fluid intake so that you increase your urine production help flush your urinary tract.  If you develop any new or worsening symptoms other return for reevaluation or follow-up with your primary care provider.

## 2024-07-27 NOTE — ED Provider Notes (Signed)
 " [facility]    CSN: [id number] Arrival date & time: 07/27/24  1936      History   Chief Complaint Chief Complaint  Patient presents with   Urinary Frequency   Dysuria    HPI Claire Curtis  is a 39 y.o. female.   HPI  39 year old female with past medical history difficult for HPV, and HSV-2 presents for evaluation of dysuria, urgency, and frequency that started 2 days ago.  She denies any vaginal discharge or itching.  No blood in the urine.  No fever.  No nausea or vomiting.  Her last menstrual cycle ended 2 days ago.  Past Medical History:  Diagnosis Date   Herpes    last outbreak, last year   Vaginal Pap smear, abnormal    Warts, genital     Patient Active Problem List   Diagnosis Date Noted   Acute appendicitis 03/17/2018   Acute appendicitis, uncomplicated    Encounter for supervision of normal pregnancy in multigravida 10/17/2013   History of cesarean section 10/17/2013   Nausea and vomiting in pregnancy prior to [redacted] weeks gestation 10/17/2013    Past Surgical History:  Procedure Laterality Date   APPENDECTOMY     CESAREAN SECTION     LAPAROSCOPIC APPENDECTOMY N/A 03/17/2018   Procedure: APPENDECTOMY LAPAROSCOPIC;  Surgeon: Wonda Charlie BRAVO, MD;  Location: [facility] ORS;  Service: General;  Laterality: N/A;    OB History     Gravida  7   Para  1   Term  1   Preterm      AB  4   Living  1      SAB      IAB  4   Ectopic      Multiple      Live Births  1            Home Medications    Prior to Admission medications  Medication Sig Start Date End Date Taking? Authorizing Provider  phenazopyridine  (PYRIDIUM ) 200 MG tablet Take 1 tablet (200 mg total) by mouth 3 (three) times daily. 07/27/24  Yes Bernardino Ditch, NP  hydrochlorothiazide (MICROZIDE) 12.5 MG capsule Take 12.5 mg by mouth every morning. 08/15/23   [provider]  levonorgestrel (MIRENA) 20 MCG/DAY IUD 1 each by Intrauterine route once.    [provider]  valACYclovir  (VALTREX ) 500 MG tablet Take 1 tablet twice a day by oral route for 3 days.    [provider]  metoCLOPramide  (REGLAN ) 10 MG tablet Take 1 tablet (10 mg total) by mouth every 6 (six) hours as needed for nausea. 03/25/19 03/06/20  Dorothyann Drivers, MD    Family History Family History  Problem Relation Age of Onset   Healthy Mother    Healthy Father     Social History Social History[1]   Allergies   Amoxicillin  and Penicillins   Review of Systems Review of Systems  Gastrointestinal:  Negative for nausea and vomiting.  Genitourinary:  Positive for dysuria, frequency and urgency. Negative for hematuria, vaginal discharge and vaginal pain.  Musculoskeletal:  Negative for back pain.     Physical Exam Triage Vital Signs ED Triage Vitals  Encounter Vitals Group     BP      Girls Systolic BP Percentile      Girls Diastolic BP Percentile      Boys Systolic BP Percentile      Boys Diastolic BP Percentile      Pulse  Resp      Temp      Temp src      SpO2      Weight      Height      Head Circumference      Peak Flow      Pain Score      Pain Loc      Pain Education      Exclude from Growth Chart    No data found.  Updated Vital Signs BP (!) 137/90 (BP Location: Right Arm)   Pulse 98   Temp 98.2 F (36.8 C) (Oral)   Resp 14   Ht 5' 4 (1.626 m)   Wt 164 lb 14.5 oz (74.8 kg)   SpO2 97%   BMI 28.31 kg/m   Visual Acuity Right Eye Distance:   Left Eye Distance:   Bilateral Distance:    Right Eye Near:   Left Eye Near:    Bilateral Near:     Physical Exam Vitals and nursing note reviewed.  Constitutional:      Appearance: Normal appearance. She is not ill-appearing.  HENT:     Head: Normocephalic and atraumatic.  Cardiovascular:     Rate and Rhythm: Normal rate and regular rhythm.     Pulses: Normal pulses.     Heart sounds: Normal heart sounds. No murmur heard.    No friction rub. No gallop.  Pulmonary:      Effort: Pulmonary effort is normal.     Breath sounds: Normal breath sounds. No wheezing, rhonchi or rales.  Abdominal:     Tenderness: There is no right CVA tenderness or left CVA tenderness.  Skin:    General: Skin is warm and dry.     Capillary Refill: Capillary refill takes less than 2 seconds.     Findings: No rash.  Neurological:     General: No focal deficit present.     Mental Status: She is alert and oriented to person, place, and time.      UC Treatments / Results  Labs (all labs ordered are listed, but only abnormal results are displayed) Labs Reviewed  POCT URINE DIPSTICK - Abnormal; Notable for the following components:      Result Value   Blood, UA trace-intact (*)    Leukocytes, UA Trace (*)    All other components within normal limits  URINE CULTURE  CERVICOVAGINAL ANCILLARY ONLY    EKG   Radiology No results found.  Procedures Procedures (including critical care time)  Medications Ordered in UC Medications - No data to display  Initial Impression / Assessment and Plan / UC Course  I have reviewed the triage vital signs and the nursing notes.  Pertinent labs & imaging results that were available during my care of the patient were reviewed by me and considered in my medical decision making (see chart for details).   Patient is a nontoxic-appearing 39 year old female presenting for evaluation of UTI symptoms that started 2 days ago.  She has had some urgency and frequency but she is unsure if that is related to the hydrochlorothiazide that she takes for her blood pressure.  She also reports that she finished her menstrual cycle 3 days ago.  Patient does have a history of UTIs as well as BV.  I will order a urine dip as well as a cytology swab.  Urine dip shows trace intact RBCs with trace leukocyte esterase.  Negative for nitrates, protein, or glucose.  I will send urine  for culture.  Will discharge patient on the diagnosis of dysuria and started on  Pyridium  pending the results of the cytology swab and culture.   Final Clinical Impressions(s) / UC Diagnoses   Final diagnoses:  UTI symptoms  Dysuria     Discharge Instructions      Your urinalysis does not show definitive proof of an infection.  We will send the urine for culture.  The cytology swab will be back in next 1 to 2 days and that we will look for the presence of BV or yeast, which can also cause UTI symptoms.  Use the Pyridium  200 mg every 8 hours as needed for urinary discomfort.  Increase your oral fluid intake so that you increase your urine production help flush your urinary tract.  If you develop any new or worsening symptoms other return for reevaluation or follow-up with your primary care provider.     ED Prescriptions     Medication Sig Dispense Auth. Provider   phenazopyridine  (PYRIDIUM ) 200 MG tablet Take 1 tablet (200 mg total) by mouth 3 (three) times daily. 6 tablet Bernardino Ditch, NP      PDMP not reviewed this encounter.    [1]  Social History Tobacco Use   Smoking status: Never   Smokeless tobacco: Never  Vaping Use   Vaping status: Never Used  Substance Use Topics   Alcohol use: Yes    Comment: occasionally   Drug use: No     Bernardino Ditch, NP 07/27/24 2001  "

## 2024-07-27 NOTE — ED Triage Notes (Signed)
 Patient c/o urinary frequency and dysuria that started 2 days ago.  Patient denies any vaginal discharge.

## 2024-07-30 LAB — CERVICOVAGINAL ANCILLARY ONLY
Bacterial Vaginitis (gardnerella): NEGATIVE
Candida Glabrata: NEGATIVE
Candida Vaginitis: NEGATIVE
Chlamydia: NEGATIVE
Comment: NEGATIVE
Comment: NEGATIVE
Comment: NEGATIVE
Comment: NEGATIVE
Comment: NEGATIVE
Comment: NORMAL
Neisseria Gonorrhea: NEGATIVE
Trichomonas: NEGATIVE

## 2024-07-30 LAB — URINE CULTURE
Culture: <10000 — AB
Special Requests: NORMAL
